# Patient Record
Sex: Female | Born: 1980 | Race: Black or African American | Hispanic: No | Marital: Single | Smoking: Never smoker
Health system: Southern US, Community
[De-identification: ages and names within clinical notes are randomized; demographics above are authoritative.]

## PROBLEM LIST (undated history)

## (undated) DIAGNOSIS — K5080 Crohn's disease of both small and large intestine without complications: Secondary | ICD-10-CM

## (undated) DIAGNOSIS — K602 Anal fissure, unspecified: Secondary | ICD-10-CM

## (undated) DIAGNOSIS — K2970 Gastritis, unspecified, without bleeding: Secondary | ICD-10-CM

## (undated) DIAGNOSIS — E538 Deficiency of other specified B group vitamins: Secondary | ICD-10-CM

## (undated) DIAGNOSIS — D509 Iron deficiency anemia, unspecified: Secondary | ICD-10-CM

## (undated) HISTORY — DX: Crohn's disease of both small and large intestine without complications: K50.80

## (undated) HISTORY — DX: Iron deficiency anemia, unspecified: D50.9

## (undated) HISTORY — DX: Gastritis, unspecified, without bleeding: K29.70

## (undated) HISTORY — DX: Deficiency of other specified B group vitamins: E53.8

## (undated) HISTORY — DX: Anal fissure, unspecified: K60.2

## (undated) HISTORY — PX: COLONOSCOPY: SHX174

## (undated) HISTORY — PX: HERNIA REPAIR: SHX51

---

## 2002-11-24 ENCOUNTER — Other Ambulatory Visit: Admission: RE | Admit: 2002-11-24 | Discharge: 2002-11-24 | Payer: Self-pay | Admitting: Family Medicine

## 2003-08-31 ENCOUNTER — Emergency Department (HOSPITAL_COMMUNITY): Admission: EM | Admit: 2003-08-31 | Discharge: 2003-08-31 | Payer: Self-pay | Admitting: Emergency Medicine

## 2003-09-20 ENCOUNTER — Emergency Department (HOSPITAL_COMMUNITY): Admission: EM | Admit: 2003-09-20 | Discharge: 2003-09-20 | Payer: Self-pay | Admitting: Emergency Medicine

## 2004-01-18 ENCOUNTER — Encounter (INDEPENDENT_AMBULATORY_CARE_PROVIDER_SITE_OTHER): Payer: Self-pay | Admitting: *Deleted

## 2004-01-18 ENCOUNTER — Ambulatory Visit (HOSPITAL_COMMUNITY): Admission: AD | Admit: 2004-01-18 | Discharge: 2004-01-18 | Payer: Self-pay | Admitting: Surgery

## 2004-01-18 HISTORY — PX: INCISION AND DRAINAGE ABSCESS ANAL: SUR669

## 2005-07-02 ENCOUNTER — Other Ambulatory Visit: Admission: RE | Admit: 2005-07-02 | Discharge: 2005-07-02 | Payer: Self-pay | Admitting: Obstetrics and Gynecology

## 2006-11-27 ENCOUNTER — Other Ambulatory Visit: Admission: RE | Admit: 2006-11-27 | Discharge: 2006-11-27 | Payer: Self-pay | Admitting: Obstetrics and Gynecology

## 2007-12-01 ENCOUNTER — Other Ambulatory Visit: Admission: RE | Admit: 2007-12-01 | Discharge: 2007-12-01 | Payer: Self-pay | Admitting: Obstetrics and Gynecology

## 2008-06-15 DIAGNOSIS — K5080 Crohn's disease of both small and large intestine without complications: Secondary | ICD-10-CM

## 2008-06-15 HISTORY — DX: Crohn's disease of both small and large intestine without complications: K50.80

## 2008-07-13 ENCOUNTER — Encounter: Payer: Self-pay | Admitting: Nurse Practitioner

## 2008-07-13 ENCOUNTER — Ambulatory Visit: Payer: Self-pay | Admitting: Gastroenterology

## 2008-07-13 ENCOUNTER — Emergency Department (HOSPITAL_COMMUNITY): Admission: EM | Admit: 2008-07-13 | Discharge: 2008-07-13 | Payer: Self-pay | Admitting: Emergency Medicine

## 2008-07-15 ENCOUNTER — Telehealth: Payer: Self-pay | Admitting: Nurse Practitioner

## 2008-07-22 ENCOUNTER — Encounter: Payer: Self-pay | Admitting: Nurse Practitioner

## 2008-07-22 ENCOUNTER — Ambulatory Visit: Payer: Self-pay | Admitting: Gastroenterology

## 2008-07-22 DIAGNOSIS — E538 Deficiency of other specified B group vitamins: Secondary | ICD-10-CM

## 2008-07-22 DIAGNOSIS — D649 Anemia, unspecified: Secondary | ICD-10-CM

## 2008-07-22 DIAGNOSIS — R1031 Right lower quadrant pain: Secondary | ICD-10-CM

## 2008-07-22 DIAGNOSIS — K5000 Crohn's disease of small intestine without complications: Secondary | ICD-10-CM

## 2008-07-23 ENCOUNTER — Ambulatory Visit: Payer: Self-pay | Admitting: Gastroenterology

## 2008-07-23 ENCOUNTER — Encounter: Payer: Self-pay | Admitting: Gastroenterology

## 2008-07-27 ENCOUNTER — Ambulatory Visit: Payer: Self-pay | Admitting: Gastroenterology

## 2008-07-28 ENCOUNTER — Telehealth: Payer: Self-pay | Admitting: Gastroenterology

## 2008-08-11 ENCOUNTER — Telehealth: Payer: Self-pay | Admitting: Gastroenterology

## 2008-08-27 ENCOUNTER — Telehealth: Payer: Self-pay | Admitting: Gastroenterology

## 2008-09-14 ENCOUNTER — Ambulatory Visit: Payer: Self-pay | Admitting: Gastroenterology

## 2008-09-14 DIAGNOSIS — R1013 Epigastric pain: Secondary | ICD-10-CM | POA: Insufficient documentation

## 2008-09-14 DIAGNOSIS — D518 Other vitamin B12 deficiency anemias: Secondary | ICD-10-CM | POA: Insufficient documentation

## 2008-09-14 DIAGNOSIS — D509 Iron deficiency anemia, unspecified: Secondary | ICD-10-CM

## 2008-12-23 ENCOUNTER — Other Ambulatory Visit: Admission: RE | Admit: 2008-12-23 | Discharge: 2008-12-23 | Payer: Self-pay | Admitting: Obstetrics and Gynecology

## 2009-12-26 ENCOUNTER — Other Ambulatory Visit: Admission: RE | Admit: 2009-12-26 | Discharge: 2009-12-26 | Payer: Self-pay | Admitting: Obstetrics and Gynecology

## 2009-12-29 ENCOUNTER — Encounter: Admission: RE | Admit: 2009-12-29 | Discharge: 2009-12-29 | Payer: Self-pay | Admitting: Obstetrics and Gynecology

## 2010-02-02 ENCOUNTER — Telehealth: Payer: Self-pay | Admitting: Gastroenterology

## 2010-08-05 ENCOUNTER — Other Ambulatory Visit: Payer: Self-pay | Admitting: Internal Medicine

## 2010-08-05 DIAGNOSIS — E041 Nontoxic single thyroid nodule: Secondary | ICD-10-CM

## 2010-08-15 NOTE — Progress Notes (Signed)
Summary: Triage  Phone Note Call from Patient Call back at [phone]   Caller: Patient Call For: Dr. Russella Dar Reason for Call: Talk to Nurse Summary of Call: Having problems w/her acid reflux wants to know if something can be prescribed Initial call taken by: Karna Christmas,  February 02, 2010 2:19 PM  Follow-up for Phone Call        Left message for patient to call back Darcey Nora RN, [facility]  February 02, 2010 2:37 PM  patient returned my call she c/o gas and bloating, epigastric pain.  She is asked to resume her Prilosec she stopped last year.  I also reviewed a low gas diet with her and suggested that she start on Align.  If she has no relief from these she will need to come in and see Dr Iva Lento.  patient agrees with the plan. Follow-up by: Darcey Nora RN, CGRN,  February 02, 2010 2:57 PM

## 2010-08-21 ENCOUNTER — Inpatient Hospital Stay: Admission: RE | Admit: 2010-08-21 | Payer: Self-pay | Source: Ambulatory Visit

## 2010-11-28 NOTE — Consult Note (Signed)
[name], [name]             ACCOUNT NO.:  [id number]   MEDICAL RECORD NO.:  [id number]          PATIENT TYPE:  EMS   LOCATION:  ED                           FACILITY:  [facility]   PHYSICIAN:  Willette Cluster, NP     DATE OF BIRTH:  [birth year]   DATE OF CONSULTATION:  07/13/2008  DATE OF DISCHARGE:                                 CONSULTATION   IMPRESSION:  10. A 30 year old black female with acute right lower quadrant pain in      the setting of a computerized axial tomography (CAT) scan revealing      wall thickening of a long segment of terminal ileum.  Normal white      count, low MCV.  Rule out infectious ileitis.  Rule out Crohn      disease.  Low suspicion for ischemia.  2. Microcytosis with hemoglobin of 11.7.  3. Mild microcytic anemia.   PLAN:  1. Stable enough for discharge home.  We will obtain iron studies, C-      reactive protein and erythrocyte sedimentation rate.  2. Prescription for Zofran, Cipro, and Flagyl given.  Patient will be      provided a prescription for Percocet to use as needed for pain.      She was cautioned against operating any machinery.  3. Clear liquid diet for the next 2 to 3 days.  4. Patient will follow up in the office next week.  Our office will      call her tomorrow with an appointment.  5. Colonoscopy pending clinical course.   HISTORY OF PRESENT ILLNESS:  [name] is a 30 year old female with no  significant past medical history.  She was awoken this morning with  severe right lower quadrant pain.  The patient had a bowel movement,  pain was not relieved.  She later took a bath, got ready for work, and  then ended up coming to the emergency department.  The patient has never  had this type of pain before.  It has not been associated with any  change in bowel habits.  The patient has a low-grade fever in the  emergency department.  She has mild nausea, but no vomiting.  There has  been no unusual weight loss at home.  No skin  rashes.  Family medical  history is negative for inflammatory bowel disease.  The patient felt  completely well prior to going to bed last night.   PAST MEDICAL HISTORY:  No significant past medical history.   HOME MEDICATIONS:  None.   ALLERGIES:  No known drug allergies.   FAMILY HISTORY:  No inflammatory bowel disease.   SOCIAL HISTORY:  Occasional alcohol.  Nonsmoker.  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  No children.   REVIEW OF SYSTEMS:  Positive for low-grade temp.  All other review of  systems are negative other than were noted in the HPI.   LABORATORY STUDIES:  WBC 7.9, hemoglobin 11.7, hematocrit 37.2, MCV  73.5, platelets 403.   DIAGNOSTIC STUDIES:  CT of the abdomen and pelvis with contrast reveals  wall thickening of a long segment of terminal ileum.  Small to moderate  amount of free fluid in the pelvis.   PHYSICAL EXAMINATION:  VITAL SIGNS:  Temp 98.3 (T-max 100).  Heart rate  103, blood pressure 138/82.  HEENT:  Conjunctivae pale.  No icterus.  No oral lesions.  Tongue moist.  NECK:  No palpable lymphadenopathy.  Supple.  No masses.  LUNGS:  Clear to auscultation bilaterally.  CARDIAC:  Slightly tachycardic.  ABDOMEN:  Soft and nondistended.  There is right lower quadrant  tenderness to palpation.  No guarding.  No masses or hepatosplenomegaly.  EXTREMITIES:  No lower extremity edema.  NEUROLOGIC:  Alert and oriented.  SKIN:  No rashes or significant lesions.      Willette Cluster, NP     PG/MEDQ  D:  07/13/2008  T:  07/13/2008  Job:  [id number]

## 2010-12-01 NOTE — Op Note (Signed)
NAMEJAYLANI, [name]                       ACCOUNT NO.:  [id number]   MEDICAL RECORD NO.:  [id number]                   PATIENT TYPE:  AMB   LOCATION:  DAY                                  FACILITY:  [facility]   PHYSICIAN:  Thornton Park. Daphine Deutscher, M.D.             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   PROCEDURE:  Incision and drainage of perirectal abscess and posterior anal  fistulotomy.  Also, incision and drainage of multiple perineal and labial  abscesses (probable hydradenitis suppurative, cultured).   SURGEON:  Thornton Park. Daphine Deutscher, M.D.   ANESTHESIA:  General endotracheal.   DESCRIPTION OF PROCEDURE:  [name] was seen by me in the surgical  day care preop after Dr. Zachery Dakins sent her over for this procedure.  Informed consent was obtained regarding the possibility of not only  infection, recurrent, but also anal incontinence.  Patient was taken over  for exam under anesthesia with drainage of his abscess and possible  fistulotomy.   In the dorsal lithotomy position after general anesthesia was administered,  I saw pus draining out posteriorly and found the draining site slightly to  the left of the midline.  I inserted a rectal probe with an anal retractor  in place.  This seemed to go through the superficial sphincter and was a  very superficial fistulous tract.  I went ahead and opened that with a  knife, cauterizing the base.  It did not seem to violate the deep  musculature.  I saw no other entrance points for that, and I could feel no  other areas of fluctuance or induration.  Therefore, I went ahead and  massaged some Betadine ointment into the area.  In addition, she had  complained of some abscesses in her thighs, and these were in fact more on  the labia majora.  There were multiple  areas, probably about five on the  right and three on the left.  The ones on the left communicated with each  other.  I opened the very thin, attenuated skin overlying these chronic  hydradenitis-appearing abscesses.  I cultured the two on the right, opened  these, and allowed material to drain out, including a little punctum in one.  These were then dressed with Betadine ointment.   The patient will be placed on Augmentin 875 mg to take twice a day for two  weeks.  As well, I  instructed her husband and asked him to put her on sitz  baths twice a day and use Hibiclens to wash her bottom and perivaginal  region.  I will see her back in the office in that time.  I also gave her a  prescription of Vicodin to take as needed for pain.  Thornton Park Daphine Deutscher, M.D.    MBM/MEDQ  D:  01/18/2004  T:  01/18/2004  Job:  [id number]

## 2011-04-20 LAB — COMPREHENSIVE METABOLIC PANEL
ALT: 10 U/L (ref 0–35)
AST: 15 U/L (ref 0–37)
CO2: 24 mEq/L (ref 19–32)
Calcium: 8.9 mg/dL (ref 8.4–10.5)
Chloride: 103 mEq/L (ref 96–112)
GFR calc Af Amer: >60 mL/min (ref >60)
GFR calc non Af Amer: >60 mL/min (ref >60)
Sodium: 138 mEq/L (ref 135–145)

## 2011-04-20 LAB — DIFFERENTIAL
Basophils Relative: 0 % (ref 0–1)
Eosinophils Absolute: 0.1 10*3/uL (ref 0.0–0.7)
Eosinophils Relative: 1 % (ref 0–5)
Lymphs Abs: 0.4 10*3/uL — ABNORMAL LOW (ref 0.7–4.0)

## 2011-04-20 LAB — CBC
MCHC: 31.5 g/dL (ref 30.0–36.0)
RBC: 5.06 MIL/uL (ref 3.87–5.11)
WBC: 7.9 10*3/uL (ref 4.0–10.5)

## 2011-04-20 LAB — FOLATE: Folate: 5.8 ng/mL

## 2011-04-20 LAB — URINALYSIS, ROUTINE W REFLEX MICROSCOPIC
Nitrite: NEGATIVE
Specific Gravity, Urine: 1.027 (ref 1.005–1.030)
pH: 8 (ref 5.0–8.0)

## 2011-04-20 LAB — IRON AND TIBC: Saturation Ratios: 8 % — ABNORMAL LOW (ref 20–55)

## 2011-04-20 LAB — VITAMIN B12: Vitamin B-12: 159 pg/mL — ABNORMAL LOW (ref 211–911)

## 2011-07-17 DIAGNOSIS — K2970 Gastritis, unspecified, without bleeding: Secondary | ICD-10-CM

## 2011-07-17 HISTORY — DX: Gastritis, unspecified, without bleeding: K29.70

## 2011-08-29 ENCOUNTER — Other Ambulatory Visit: Payer: Self-pay | Admitting: Obstetrics and Gynecology

## 2011-08-29 ENCOUNTER — Other Ambulatory Visit (HOSPITAL_COMMUNITY)
Admission: RE | Admit: 2011-08-29 | Discharge: 2011-08-29 | Disposition: A | Discharge status: Home / Self Care | Payer: BC Managed Care – PPO | Source: Ambulatory Visit | Attending: Obstetrics and Gynecology | Admitting: Obstetrics and Gynecology

## 2011-08-29 DIAGNOSIS — Z1159 Encounter for screening for other viral diseases: Secondary | ICD-10-CM | POA: Insufficient documentation

## 2011-08-29 DIAGNOSIS — Z01419 Encounter for gynecological examination (general) (routine) without abnormal findings: Secondary | ICD-10-CM | POA: Insufficient documentation

## 2011-12-13 ENCOUNTER — Encounter: Payer: Self-pay | Admitting: Gastroenterology

## 2012-01-08 ENCOUNTER — Other Ambulatory Visit (INDEPENDENT_AMBULATORY_CARE_PROVIDER_SITE_OTHER): Payer: BC Managed Care – PPO

## 2012-01-08 ENCOUNTER — Encounter: Payer: Self-pay | Admitting: Gastroenterology

## 2012-01-08 ENCOUNTER — Ambulatory Visit (INDEPENDENT_AMBULATORY_CARE_PROVIDER_SITE_OTHER): Payer: BC Managed Care – PPO | Admitting: Gastroenterology

## 2012-01-08 VITALS — BP 102/68 | Ht 68.0 in | Wt 158.6 lb

## 2012-01-08 DIAGNOSIS — K5080 Crohn's disease of both small and large intestine without complications: Secondary | ICD-10-CM

## 2012-01-08 DIAGNOSIS — E538 Deficiency of other specified B group vitamins: Secondary | ICD-10-CM

## 2012-01-08 DIAGNOSIS — R1319 Other dysphagia: Secondary | ICD-10-CM

## 2012-01-08 LAB — CBC WITH DIFFERENTIAL/PLATELET
Basophils Absolute: 0 10*3/uL (ref 0.0–0.1)
Basophils Relative: 0.5 % (ref 0.0–3.0)
Eosinophils Absolute: 0.2 10*3/uL (ref 0.0–0.7)
Hemoglobin: 9.9 g/dL — ABNORMAL LOW (ref 12.0–15.0)
Lymphocytes Relative: 14.4 % (ref 12.0–46.0)
MCHC: 31 g/dL (ref 30.0–36.0)
MCV: 74.7 fl — ABNORMAL LOW (ref 78.0–100.0)
Monocytes Absolute: 0.6 10*3/uL (ref 0.1–1.0)
Neutro Abs: 4 10*3/uL (ref 1.4–7.7)
RDW: 17.5 % — ABNORMAL HIGH (ref 11.5–14.6)
WBC: 5.6 10*3/uL (ref 4.5–10.5)

## 2012-01-08 LAB — IBC PANEL
Iron: 14 ug/dL — ABNORMAL LOW (ref 42–145)
Saturation Ratios: 4.6 % — ABNORMAL LOW (ref 20.0–50.0)
Transferrin: 217.7 mg/dL (ref 212.0–360.0)

## 2012-01-08 LAB — FERRITIN: Ferritin: 18.8 ng/mL (ref 10.0–291.0)

## 2012-01-08 LAB — COMPREHENSIVE METABOLIC PANEL
ALT: 11 U/L (ref 0–35)
AST: 17 U/L (ref 0–37)
Alkaline Phosphatase: 54 U/L (ref 39–117)
BUN: 7 mg/dL (ref 6–23)
Calcium: 8.5 mg/dL (ref 8.4–10.5)
Chloride: 105 mEq/L (ref 96–112)
Creatinine, Ser: 0.7 mg/dL (ref 0.4–1.2)
Total Bilirubin: 0.2 mg/dL — ABNORMAL LOW (ref 0.3–1.2)

## 2012-01-08 LAB — VITAMIN B12: Vitamin B-12: 167 pg/mL — ABNORMAL LOW (ref 211–911)

## 2012-01-08 LAB — TSH: TSH: 0.61 u[IU]/mL (ref 0.35–5.50)

## 2012-01-08 MED ORDER — OMEPRAZOLE 20 MG PO CPDR: 20.0000 mg | DELAYED_RELEASE_CAPSULE | Freq: Every day | ORAL | Status: DC

## 2012-01-08 NOTE — Patient Instructions (Addendum)
Your physician has requested that you go to the basement for the following lab work before leaving today:CBC, Cmet, Vitamin b12, Iron studies,TSH. You have been scheduled for an endoscopy with propofol. Please follow written instructions given to you at your visit today. You have been scheduled for a Barium Esophogram at [facility] (1st floor of the hospital) on 01/10/12 at 11:00am. Please arrive 15 minutes prior to your appointment for registration. Make certain not to have anything to eat or drink 6 hours prior to your test. If you need to reschedule for any reason, please contact radiology at [phone] to do so. Stop taking Zantac and pick up your prescription for omeprazole from your pharmacy.  Cc: [facility]

## 2012-01-08 NOTE — Progress Notes (Addendum)
History of Present Illness: This is a 31 year old female who complains of difficulty swallowing solids and liquids for the past several months. She has intermittent mild postprandial epigastric pain. She was treated with ranitidine 150 mg twice daily without any improvement in symptoms. She has a history of Crohn's ileocolitis and has not returned for recommended follow up for 3 years. She has a prior history of B12 deficiency and she has been noncompliant with treatment. Denies weight loss, abdominal pain, constipation, diarrhea, change in stool caliber, melena, hematochezia, nausea, vomiting, reflux symptoms, chest pain. Not on File No outpatient prescriptions prior to visit.   Past Medical History  Diagnosis Date  . Crohn's ileocolitis 06/2008  . Iron deficiency anemia   . B12 deficiency   . Anal fissure    Past Surgical History  Procedure Date  . Rectovaginal fistula closure   . Hernia repair    History   Social History  . Marital Status: Married    Spouse Name: N/A    Number of Children: N/A  . Years of Education: N/A   Occupational History  . [employer]    Social History Main Topics  . Smoking status: Never Smoker   . Smokeless tobacco: Never Used  . Alcohol Use: No  . Drug Use: No  . Sexually Active: None   Other Topics Concern  . None   Social History Narrative  . None   Family History  Problem Relation Age of Onset  . Lung cancer Father    Review of Systems: Pertinent positive and negative review of systems were noted in the above HPI section. All other review of systems were otherwise negative.  Physical Exam: General: Well developed , well nourished, no acute distress Head: Normocephalic and atraumatic Eyes:  sclerae anicteric, EOMI Ears: Normal auditory acuity Mouth: No deformity or lesions Neck: Supple, no masses or thyromegaly Lungs: Clear throughout to auscultation Heart: Regular rate and rhythm; no rubs or bruits; soft systolic murmur Abdomen:  Soft, mild epigastric tenderness to deep palpation without rebound or guarding and non distended. No masses, hepatosplenomegaly or hernias noted. Normal Bowel sounds Musculoskeletal: Symmetrical with no gross deformities  Skin: No lesions on visible extremities Pulses:  Normal pulses noted Extremities: No clubbing, cyanosis, edema or deformities noted Neurological: Alert oriented x 4, grossly nonfocal Cervical Nodes:  No significant cervical adenopathy Inguinal Nodes: No significant inguinal adenopathy Psychological:  Alert and cooperative. Normal mood and affect  Assessment and Recommendations:  1. Dysphagia to solids and liquids. Rule out esophagitis, GERD, strictures and motility disturbances. Change to omeprazole 20 mg daily. Schedule barium esophagram with tablet and upper endoscopy.  The risks, benefits, and alternatives to endoscopy with possible biopsy and possible dilation were discussed with the patient and they consent to proceed.   2. Crohn's ileocolitis. She does not appear to have active symptoms at this point. CBC, TSH and CMET today. She has been noncompliant with recommended follow up.  3. History of B12 deficiency anemia. Obtain a CBC and B12 level. She's been noncompliant with recommended treatment and follow up.   4. History of iron deficiency anemia. CBC and iron studies today.

## 2012-01-09 ENCOUNTER — Other Ambulatory Visit: Payer: Self-pay

## 2012-01-09 ENCOUNTER — Encounter: Payer: Self-pay | Admitting: Gastroenterology

## 2012-01-09 DIAGNOSIS — E538 Deficiency of other specified B group vitamins: Secondary | ICD-10-CM

## 2012-01-09 MED ORDER — FERROUS SULFATE 325 (65 FE) MG PO TABS: 325.0000 mg | ORAL_TABLET | Freq: Two times a day (BID) | ORAL | Status: DC

## 2012-01-09 MED ORDER — CYANOCOBALAMIN 1000 MCG/ML IJ SOLN
1000.0000 ug | INTRAMUSCULAR | Status: AC
  Administered 2012-01-10 – 2012-02-01 (×4): 1000 ug via INTRAMUSCULAR

## 2012-01-10 ENCOUNTER — Ambulatory Visit (HOSPITAL_COMMUNITY)
Admission: RE | Admit: 2012-01-10 | Discharge: 2012-01-10 | Disposition: A | Discharge status: Home / Self Care | Payer: BC Managed Care – PPO | Source: Ambulatory Visit | Attending: Gastroenterology | Admitting: Gastroenterology

## 2012-01-10 ENCOUNTER — Ambulatory Visit (INDEPENDENT_AMBULATORY_CARE_PROVIDER_SITE_OTHER): Payer: BC Managed Care – PPO | Admitting: Gastroenterology

## 2012-01-10 DIAGNOSIS — K2289 Other specified disease of esophagus: Secondary | ICD-10-CM | POA: Insufficient documentation

## 2012-01-10 DIAGNOSIS — R1310 Dysphagia, unspecified: Secondary | ICD-10-CM | POA: Insufficient documentation

## 2012-01-10 DIAGNOSIS — R1319 Other dysphagia: Secondary | ICD-10-CM

## 2012-01-10 DIAGNOSIS — E538 Deficiency of other specified B group vitamins: Secondary | ICD-10-CM

## 2012-01-10 DIAGNOSIS — K224 Dyskinesia of esophagus: Secondary | ICD-10-CM | POA: Insufficient documentation

## 2012-01-10 DIAGNOSIS — K228 Other specified diseases of esophagus: Secondary | ICD-10-CM | POA: Insufficient documentation

## 2012-01-10 DIAGNOSIS — D519 Vitamin B12 deficiency anemia, unspecified: Secondary | ICD-10-CM

## 2012-01-15 ENCOUNTER — Other Ambulatory Visit: Payer: Self-pay | Admitting: Family Medicine

## 2012-01-15 DIAGNOSIS — E010 Iodine-deficiency related diffuse (endemic) goiter: Secondary | ICD-10-CM

## 2012-01-15 DIAGNOSIS — E041 Nontoxic single thyroid nodule: Secondary | ICD-10-CM

## 2012-01-18 ENCOUNTER — Ambulatory Visit (INDEPENDENT_AMBULATORY_CARE_PROVIDER_SITE_OTHER): Payer: BC Managed Care – PPO | Admitting: Gastroenterology

## 2012-01-18 DIAGNOSIS — E538 Deficiency of other specified B group vitamins: Secondary | ICD-10-CM

## 2012-01-18 DIAGNOSIS — D519 Vitamin B12 deficiency anemia, unspecified: Secondary | ICD-10-CM

## 2012-01-21 ENCOUNTER — Ambulatory Visit
Admission: RE | Admit: 2012-01-21 | Discharge: 2012-01-21 | Disposition: A | Discharge status: Home / Self Care | Payer: BC Managed Care – PPO | Source: Ambulatory Visit | Attending: Family Medicine | Admitting: Family Medicine

## 2012-01-21 DIAGNOSIS — E010 Iodine-deficiency related diffuse (endemic) goiter: Secondary | ICD-10-CM

## 2012-01-21 DIAGNOSIS — E041 Nontoxic single thyroid nodule: Secondary | ICD-10-CM

## 2012-01-25 ENCOUNTER — Ambulatory Visit (INDEPENDENT_AMBULATORY_CARE_PROVIDER_SITE_OTHER): Payer: BC Managed Care – PPO | Admitting: Gastroenterology

## 2012-01-25 DIAGNOSIS — E538 Deficiency of other specified B group vitamins: Secondary | ICD-10-CM

## 2012-01-25 DIAGNOSIS — D518 Other vitamin B12 deficiency anemias: Secondary | ICD-10-CM

## 2012-02-01 ENCOUNTER — Ambulatory Visit (INDEPENDENT_AMBULATORY_CARE_PROVIDER_SITE_OTHER): Payer: BC Managed Care – PPO | Admitting: Gastroenterology

## 2012-02-01 DIAGNOSIS — E538 Deficiency of other specified B group vitamins: Secondary | ICD-10-CM

## 2012-02-04 ENCOUNTER — Telehealth: Payer: Self-pay | Admitting: Gastroenterology

## 2012-02-04 NOTE — Telephone Encounter (Signed)
Left a message informing patient that if she has had her 4 weekly injections already then she needs to go on monthly injections starting on 03/03/12. Told her if she has not established with a PCP yet then she can schedule with [place] her next injection on 03/03/12. Then that should of gave her ample time to establish with a PCP. Told her to call me back with any other questions.

## 2012-02-15 ENCOUNTER — Ambulatory Visit (AMBULATORY_SURGERY_CENTER): Payer: BC Managed Care – PPO | Admitting: Gastroenterology

## 2012-02-15 ENCOUNTER — Encounter: Payer: Self-pay | Admitting: Gastroenterology

## 2012-02-15 VITALS — BP 119/73 | HR 94 | Temp 97.0°F | Resp 20 | Ht 68.0 in | Wt 158.0 lb

## 2012-02-15 DIAGNOSIS — K2990 Gastroduodenitis, unspecified, without bleeding: Secondary | ICD-10-CM

## 2012-02-15 DIAGNOSIS — R933 Abnormal findings on diagnostic imaging of other parts of digestive tract: Secondary | ICD-10-CM

## 2012-02-15 DIAGNOSIS — K2970 Gastritis, unspecified, without bleeding: Secondary | ICD-10-CM

## 2012-02-15 DIAGNOSIS — R1319 Other dysphagia: Secondary | ICD-10-CM

## 2012-02-15 DIAGNOSIS — D509 Iron deficiency anemia, unspecified: Secondary | ICD-10-CM

## 2012-02-15 MED ORDER — SODIUM CHLORIDE 0.9 % IV SOLN: 500.0000 mL | INTRAVENOUS | Status: DC

## 2012-02-15 NOTE — Progress Notes (Signed)
Patient did not experience any of the following events: a burn prior to discharge; a fall within the facility; wrong site/side/patient/procedure/implant event; or a hospital transfer or hospital admission upon discharge from the facility. (G8907) Patient did not have preoperative order for IV antibiotic SSI prophylaxis. (G8918)  

## 2012-02-15 NOTE — Op Note (Signed)
[facility] [address] [place], [place]  [address]  ENDOSCOPY PROCEDURE REPORT PATIENT:  Laura, Benjamin  MR#:  [id number] BIRTHDATE:  Aug 22, 1980, 31 yrs. old  GENDER:  female ENDOSCOPIST:  Judie Petit T. Russella Dar, MD, [facility]  PROCEDURE DATE:  02/15/2012 PROCEDURE:  EGD with biopsy and with dilatation over guidewire ASA CLASS:  Class II INDICATIONS:  dysphagia, abnormal imaging, iron deficiency anemia  MEDICATIONS:    MAC sedation, administered by CRNA, propofol (Diprivan) 200 mg IV, glycopyrrolate (Robinal) 0.2 mg TOPICAL ANESTHETIC:  Cetacaine Spray DESCRIPTION OF PROCEDURE:   After the risks benefits and alternatives of the procedure were thoroughly explained, informed consent was obtained.  The LB-GIF Q180 [id number] endoscope was introduced through the mouth and advanced to the second portion of the duodenum, without limitations.  The instrument was slowly withdrawn as the mucosa was fully examined. <<PROCEDUREIMAGES>> Mild gastritis was found in the antrum. It was erosive and erythematous. Multiple biopsies were obtained and sent to pathology.  Otherwise normal stomach.  The duodenal bulb was normal in appearance, as was the postbulbar duodenum.  The esophagus and gastroesophageal junction were completely normal in appearance. Savary / guidewire 17 mm with no heme or resistance noted.  Retroflexed views revealed no abnormalities.    The scope was then withdrawn from the patient and the procedure completed.  COMPLICATIONS:  None  ENDOSCOPIC IMPRESSION: 1) Mild gastritis in the antrum  RECOMMENDATIONS: 1) Anti-reflux regimen 2) Await pathology results 3) PPI qam, FeS04 bid, B12 monthly 4) Post dilation instructions  Shawna Wearing T. Russella Dar, MD, [facility]  CC:  Cain Saupe, MD  n. Rosalie DoctorVenita Lick. Lakshya Mcgillicuddy at 02/15/2012 03:23 PM  [place], [place], [phone]

## 2012-02-15 NOTE — Patient Instructions (Addendum)
Follow dilation diet today. See handout given. Nothing by mouth until  4:20 pm, then clear liquids for 1 hour, then soft foods rest of today. Resume regular diet in the morning. Take medications as directed by Dr.Stark. Daily Prilosec, Iron twice a day, and monthly B12. Anti-reflux handout given. Biopsies taken. Await pathology results. Thank you!!  YOU HAD AN ENDOSCOPIC PROCEDURE TODAY AT THE [facility]: Refer to the procedure report that was given to you for any specific questions about what was found during the examination.  If the procedure report does not answer your questions, please call your gastroenterologist to clarify.  If you requested that your care partner not be given the details of your procedure findings, then the procedure report has been included in a sealed envelope for you to review at your convenience later.  YOU SHOULD EXPECT: Some feelings of bloating in the abdomen. Passage of more gas than usual.  Walking can help get rid of the air that was put into your GI tract during the procedure and reduce the bloating. If you had a lower endoscopy (such as a colonoscopy or flexible sigmoidoscopy) you may notice spotting of blood in your stool or on the toilet paper. If you underwent a bowel prep for your procedure, then you may not have a normal bowel movement for a few days.  DIET: Your first meal following the procedure should be a light meal and then it is ok to progress to your normal diet.  A half-sandwich or bowl of soup is an example of a good first meal.  Heavy or fried foods are harder to digest and may make you feel nauseous or bloated.  Likewise meals heavy in dairy and vegetables can cause extra gas to form and this can also increase the bloating.  Drink plenty of fluids but you should avoid alcoholic beverages for 24 hours.  ACTIVITY: Your care partner should take you home directly after the procedure.  You should plan to take it easy, moving slowly for the rest of  the day.  You can resume normal activity the day after the procedure however you should NOT DRIVE or use heavy machinery for 24 hours (because of the sedation medicines used during the test).    SYMPTOMS TO REPORT IMMEDIATELY: A gastroenterologist can be reached at any hour.  During normal business hours, 8:30 AM to 5:00 PM Monday through Friday, call [phone].  After hours and on weekends, please call the GI answering service at [phone] who will take a message and have the physician on call contact you.   Following upper endoscopy (EGD)  Vomiting of blood or coffee ground material  New chest pain or pain under the shoulder blades  Painful or persistently difficult swallowing  New shortness of breath  Fever of 100F or higher  Black, tarry-looking stools  FOLLOW UP: If any biopsies were taken you will be contacted by phone or by letter within the next 1-3 weeks.  Call your gastroenterologist if you have not heard about the biopsies in 3 weeks.  Our staff will call the home number listed on your records the next business day following your procedure to check on you and address any questions or concerns that you may have at that time regarding the information given to you following your procedure. This is a courtesy call and so if there is no answer at the home number and we have not heard from you through the emergency physician on call, we  will assume that you have returned to your regular daily activities without incident.  SIGNATURES/CONFIDENTIALITY: You and/or your care partner have signed paperwork which will be entered into your electronic medical record.  These signatures attest to the fact that that the information above on your After Visit Summary has been reviewed and is understood.  Full responsibility of the confidentiality of this discharge information lies with you and/or your care-partner.

## 2012-02-18 ENCOUNTER — Telehealth: Payer: Self-pay

## 2012-02-18 NOTE — Telephone Encounter (Signed)
Left message on answering machine. 

## 2012-02-20 ENCOUNTER — Encounter: Payer: Self-pay | Admitting: Gastroenterology

## 2012-09-12 ENCOUNTER — Other Ambulatory Visit (HOSPITAL_COMMUNITY)
Admission: RE | Admit: 2012-09-12 | Discharge: 2012-09-12 | Disposition: A | Discharge status: Home / Self Care | Payer: BC Managed Care – PPO | Source: Ambulatory Visit | Attending: Obstetrics and Gynecology | Admitting: Obstetrics and Gynecology

## 2012-09-12 ENCOUNTER — Other Ambulatory Visit: Payer: Self-pay | Admitting: Obstetrics and Gynecology

## 2012-09-12 DIAGNOSIS — Z1151 Encounter for screening for human papillomavirus (HPV): Secondary | ICD-10-CM | POA: Insufficient documentation

## 2012-09-12 DIAGNOSIS — Z01419 Encounter for gynecological examination (general) (routine) without abnormal findings: Secondary | ICD-10-CM | POA: Insufficient documentation

## 2013-04-01 IMAGING — US US SOFT TISSUE HEAD/NECK
1 series · 13 of 25 positions shown · non-contrast
Comparison: Thyroid ultrasound 12/29/2009.

CLINICAL DATA: Thyromegaly.

THYROID ULTRASOUND
TECHNIQUE: Ultrasound examination of the thyroid gland and adjacent
soft tissues was performed.

[Series 1: us soft tissue head/neck · 0.07mm/px · 13 of 50 slices shown]
[im 1/50]
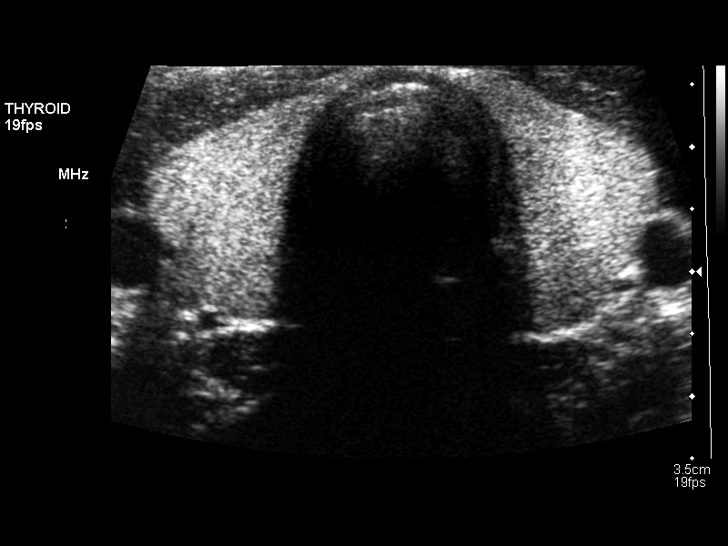
[im 5/50]
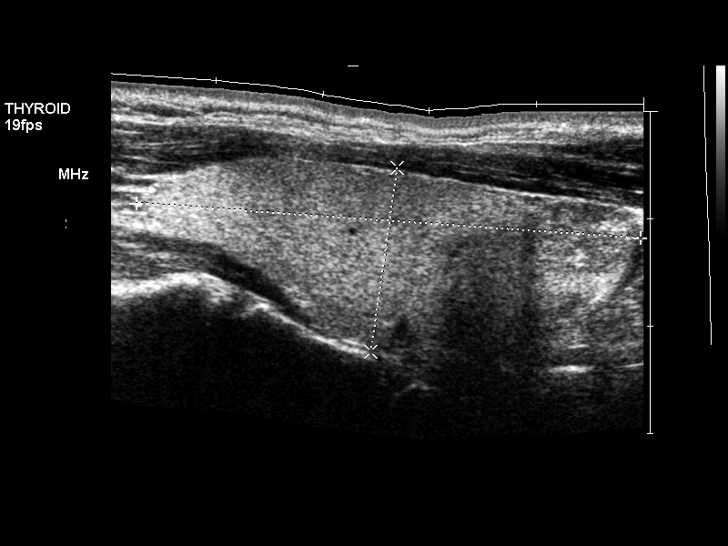
[im 9/50]
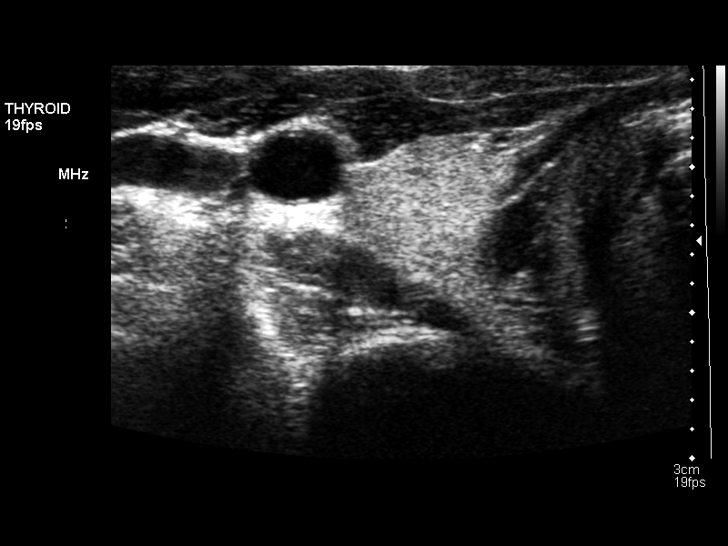
[im 13/50]
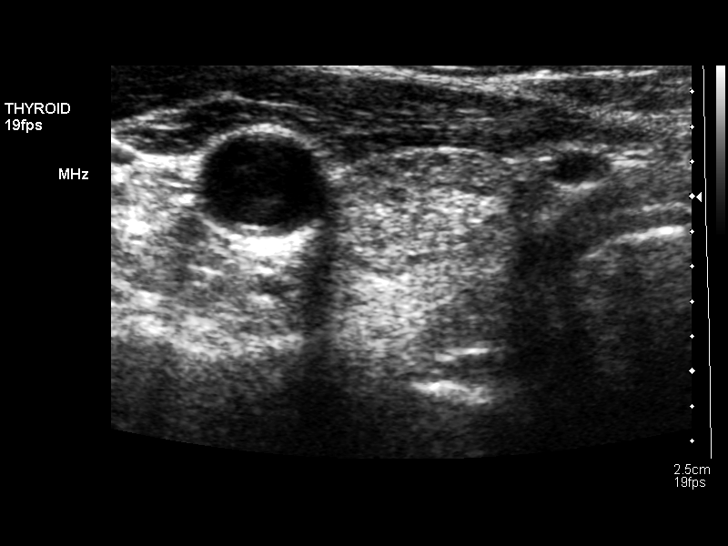
[im 17/50]
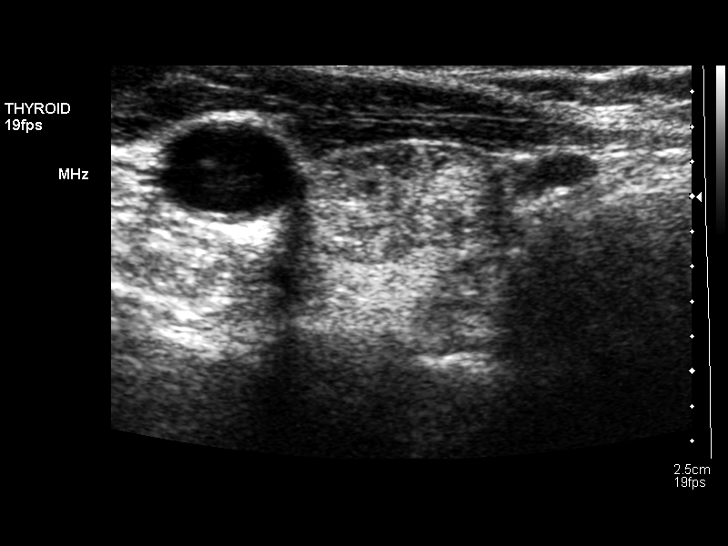
[im 21/50]
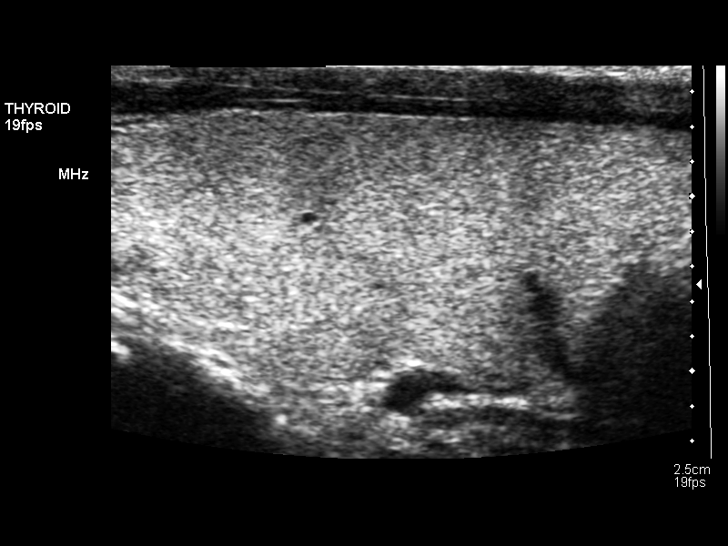
[im 25/50]
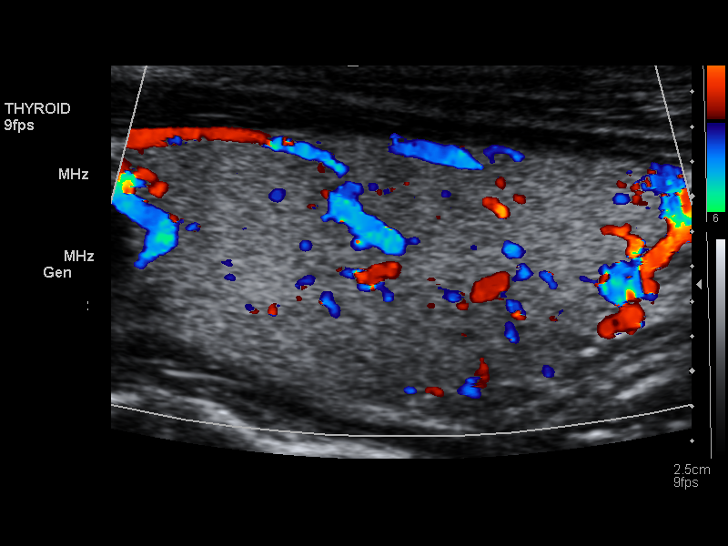
[im 29/50]
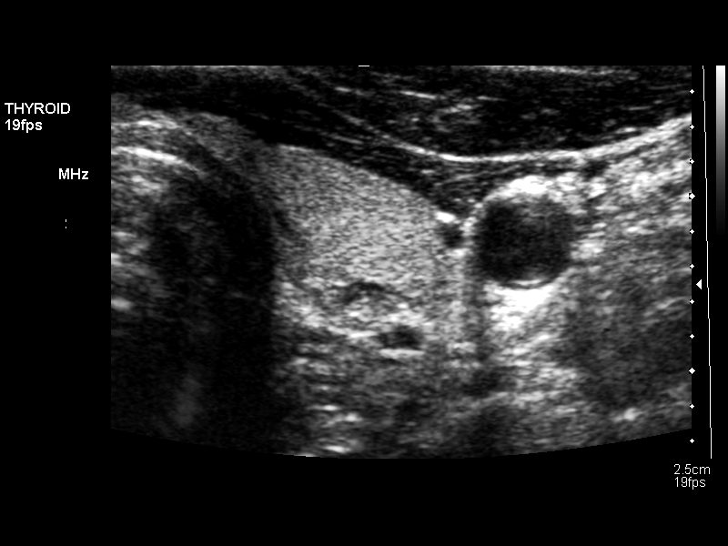
[im 33/50]
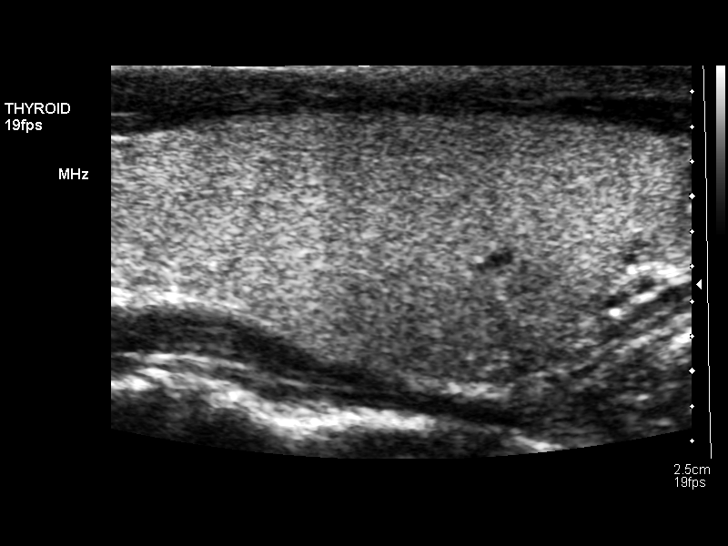
[im 37/50]
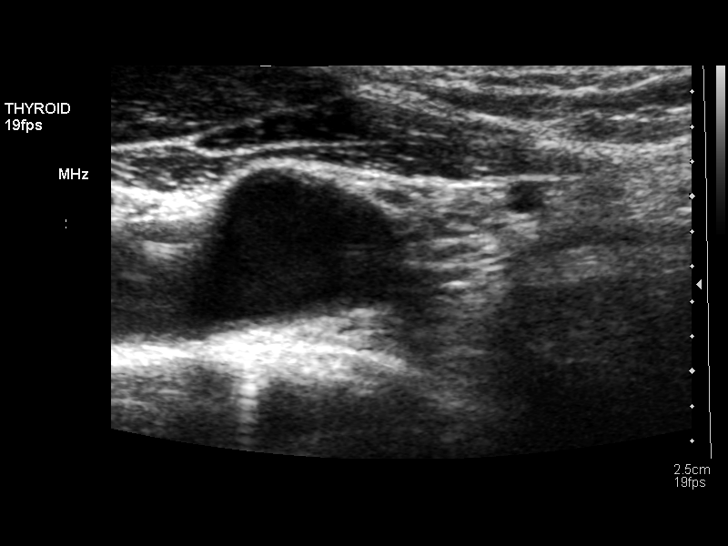
[im 41/50]
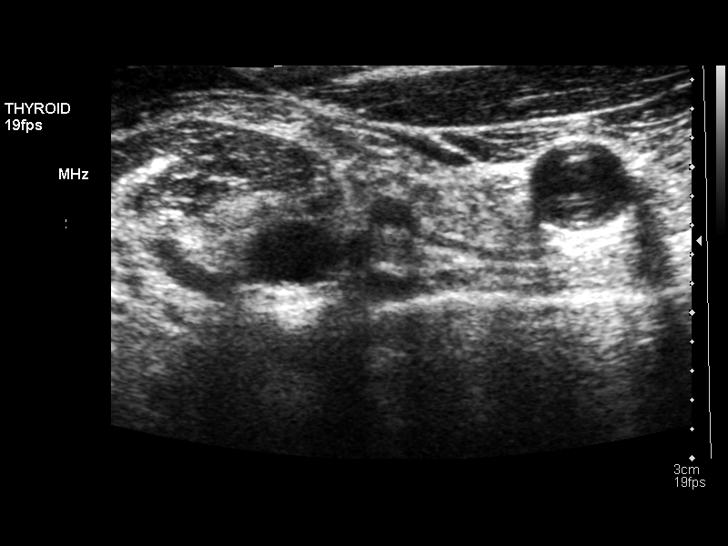
[im 45/50]
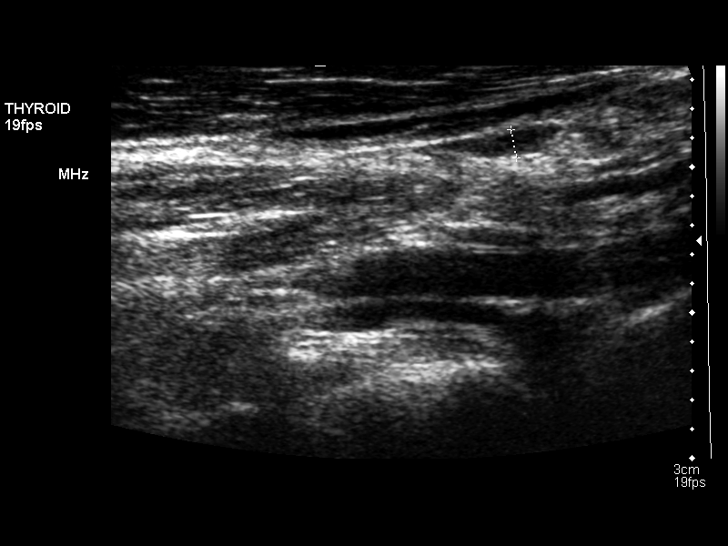
[im 50/50]
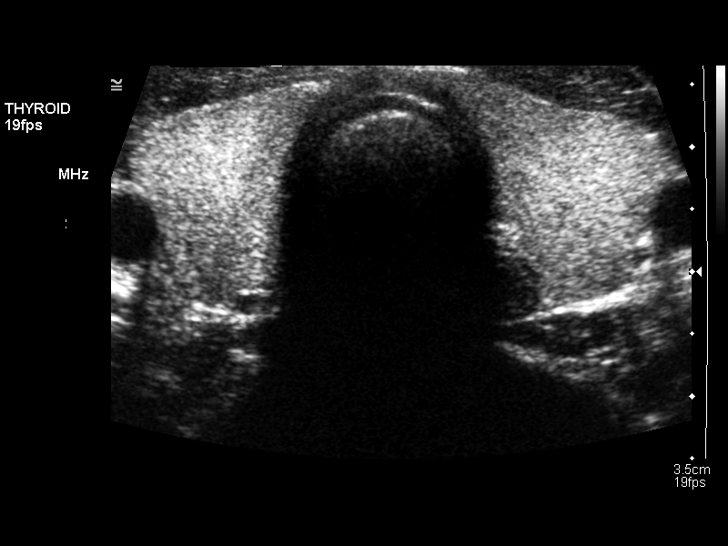

[13 of 25 positions shown; findings below may reference images not displayed]

FINDINGS: Right thyroid lobe:  4.7 x 1.7 x 1.3 cm.
Left thyroid lobe:  3.9 x 1.8 x 1.5 cm.
Isthmus:  2.6 mm in thickness.

Focal nodules:  The thyroid echotexture is homogeneous.  A mildly
heterogeneous primarily echogenic nodule inferiorly in the right
lobe measures 1.1 x 0.7 x 1.2 cm.  This is solid with internal
blood flow.  This previously measured 1.0 x 0.7 x 0.6 cm, although
the transverse dimension may have been underestimated.  Gross
appearance is similar.  No other nodules are identified.

Lymphadenopathy:  None visualized.
IMPRESSION: Solid nodule inferiorly in the right lobe may be slightly larger in
one dimension, although this difference may be technical. Findings
do not meet current SRU consensus criteria for biopsy.  Follow-up
by clinical exam is recommended.  If patient has known risk factors
for thyroid carcinoma, consider continued follow-up ultrasound in
12-24 months.  If patient is clinically hyperthyroid, consider
nuclear medicine thyroid uptake and scan.

## 2013-04-22 ENCOUNTER — Other Ambulatory Visit: Payer: Self-pay | Admitting: Family Medicine

## 2013-04-22 DIAGNOSIS — E041 Nontoxic single thyroid nodule: Secondary | ICD-10-CM

## 2013-04-27 ENCOUNTER — Other Ambulatory Visit: Payer: BC Managed Care – PPO

## 2013-07-28 ENCOUNTER — Emergency Department (HOSPITAL_COMMUNITY)
Admission: EM | Admit: 2013-07-28 | Discharge: 2013-07-28 | Disposition: A | Discharge status: Home / Self Care | Payer: BC Managed Care – PPO | Attending: Emergency Medicine | Admitting: Emergency Medicine

## 2013-07-28 ENCOUNTER — Encounter (HOSPITAL_COMMUNITY): Payer: Self-pay | Admitting: Emergency Medicine

## 2013-07-28 DIAGNOSIS — Z8719 Personal history of other diseases of the digestive system: Secondary | ICD-10-CM | POA: Insufficient documentation

## 2013-07-28 DIAGNOSIS — Z862 Personal history of diseases of the blood and blood-forming organs and certain disorders involving the immune mechanism: Secondary | ICD-10-CM | POA: Insufficient documentation

## 2013-07-28 DIAGNOSIS — R000 Tachycardia, unspecified: Secondary | ICD-10-CM | POA: Insufficient documentation

## 2013-07-28 DIAGNOSIS — J111 Influenza due to unidentified influenza virus with other respiratory manifestations: Secondary | ICD-10-CM | POA: Insufficient documentation

## 2013-07-28 DIAGNOSIS — Z79899 Other long term (current) drug therapy: Secondary | ICD-10-CM | POA: Insufficient documentation

## 2013-07-28 MED ORDER — KETOROLAC TROMETHAMINE 30 MG/ML IJ SOLN
30.0000 mg | Freq: Once | INTRAMUSCULAR | Status: AC
  Administered 2013-07-28: 30 mg via INTRAMUSCULAR
  Filled 2013-07-28: qty 1

## 2013-07-28 MED ORDER — GUAIFENESIN-CODEINE 100-10 MG/5ML PO SYRP: 5.0000 mL | ORAL_SOLUTION | Freq: Three times a day (TID) | ORAL | Status: DC | PRN

## 2013-07-28 NOTE — Discharge Instructions (Signed)

## 2013-07-28 NOTE — ED Provider Notes (Signed)
CSN: [id number]     Arrival date & time 07/28/13  1813 History   First MD Initiated Contact with Patient 07/28/13 1933     Chief Complaint  Patient presents with  . Influenza   (Consider location/radiation/quality/duration/timing/severity/associated sxs/prior Treatment) HPI  33 year old female with flulike symptoms. Symptom onset Monday. Progressively worsening. Fever 103. Diffuse body aches. Sore throat. Chills. Nonproductive cough. No shortness of breath. Headaches. No sick contacts. Patient is otherwise healthy. Does not receive his flu shot this year. No interventions prior to arrival.  Past Medical History  Diagnosis Date  . Crohn's ileocolitis 06/2008  . Iron deficiency anemia   . B12 deficiency   . Anal fissure    Past Surgical History  Procedure Laterality Date  . Rectovaginal fistula closure    . Hernia repair    . Colonoscopy     Family History  Problem Relation Age of Onset  . Lung cancer Father    History  Substance Use Topics  . Smoking status: Never Smoker   . Smokeless tobacco: Never Used  . Alcohol Use: No   OB History   Grav Para Term Preterm Abortions TAB SAB Ect Mult Living                 Review of Systems  All systems reviewed and negative, other than as noted in HPI.   Allergies  Review of patient's allergies indicates no known allergies.  Home Medications   Current Outpatient Rx  Name  Route  Sig  Dispense  Refill  . EXPIRED: omeprazole (PRILOSEC) 20 MG capsule   Oral   Take 1 capsule (20 mg total) by mouth daily.   30 capsule   11    BP 114/66  Pulse 108  Temp(Src) 102.2 F (39 C) (Oral)  Resp 20  Ht 5\' 11"  (1.803 m)  Wt 150 lb (68.04 kg)  BMI 20.93 kg/m2  SpO2 100% Physical Exam  Nursing note and vitals reviewed. Constitutional: She appears well-developed and well-nourished. No distress.  HENT:  Head: Normocephalic and atraumatic.  Eyes: Conjunctivae are normal. Right eye exhibits no discharge. Left eye exhibits no  discharge.  Neck: Neck supple.  Cardiovascular: Regular rhythm and normal heart sounds.  Exam reveals no gallop and no friction rub.   No murmur heard. tachy  Pulmonary/Chest: Effort normal and breath sounds normal. No respiratory distress.  Abdominal: Soft. She exhibits no distension. There is no tenderness.  Genitourinary:  No cva tenderness  Musculoskeletal: She exhibits no edema and no tenderness.  Neurological: She is alert.  Skin: Skin is warm and dry.  Psychiatric: She has a normal mood and affect. Her behavior is normal. Thought content normal.    ED Course  Procedures (including critical care time) Labs Review Labs Reviewed - No data to display Imaging Review No results found.  EKG Interpretation   None       MDM   1. Influenza    33 year old female with likely influenza. Patient is otherwise healthy. Tachycardic likely secondary to fever, otherwise hemodynamically stable. No increased work of breathing. Plan symptomatic tx at this time. Return precautions discussed.    Raeford RazorStephen Josephine Rudnick, MD 08/01/13 2021

## 2013-07-28 NOTE — ED Notes (Signed)
Pt presents to department for evaluation of flu like symptoms. Pt states body aches, headache, chills/fever and cough. Ongoing since Monday. Fever of 103.0. Pt is alert and oriented x4. Respirations unlabored. Pt is alert and oriented x4.

## 2013-07-28 NOTE — ED Notes (Signed)
Patient currently waiting med time before discharge.  

## 2013-08-19 ENCOUNTER — Telehealth: Payer: Self-pay | Admitting: Gastroenterology

## 2013-08-19 NOTE — Telephone Encounter (Signed)
completed

## 2013-09-11 ENCOUNTER — Other Ambulatory Visit: Payer: Self-pay | Admitting: Obstetrics and Gynecology

## 2013-09-11 ENCOUNTER — Other Ambulatory Visit (HOSPITAL_COMMUNITY)
Admission: RE | Admit: 2013-09-11 | Discharge: 2013-09-11 | Disposition: A | Discharge status: Home / Self Care | Payer: BC Managed Care – PPO | Source: Ambulatory Visit | Attending: Obstetrics and Gynecology | Admitting: Obstetrics and Gynecology

## 2013-09-11 DIAGNOSIS — Z124 Encounter for screening for malignant neoplasm of cervix: Secondary | ICD-10-CM | POA: Insufficient documentation

## 2013-09-11 DIAGNOSIS — Z1151 Encounter for screening for human papillomavirus (HPV): Secondary | ICD-10-CM | POA: Insufficient documentation

## 2013-11-23 ENCOUNTER — Other Ambulatory Visit: Payer: Self-pay | Admitting: Family

## 2013-11-23 DIAGNOSIS — E041 Nontoxic single thyroid nodule: Secondary | ICD-10-CM

## 2013-11-30 ENCOUNTER — Ambulatory Visit
Admission: RE | Admit: 2013-11-30 | Discharge: 2013-11-30 | Disposition: A | Discharge status: Home / Self Care | Payer: BC Managed Care – PPO | Source: Ambulatory Visit | Attending: Family | Admitting: Family

## 2013-11-30 DIAGNOSIS — E041 Nontoxic single thyroid nodule: Secondary | ICD-10-CM

## 2014-03-08 ENCOUNTER — Encounter (INDEPENDENT_AMBULATORY_CARE_PROVIDER_SITE_OTHER): Payer: Self-pay | Admitting: Surgery

## 2014-03-08 ENCOUNTER — Ambulatory Visit (INDEPENDENT_AMBULATORY_CARE_PROVIDER_SITE_OTHER): Payer: BC Managed Care – PPO | Admitting: Surgery

## 2014-03-08 VITALS — BP 90/62 | HR 68 | Temp 98.1°F | Ht 66.0 in | Wt 139.5 lb

## 2014-03-08 DIAGNOSIS — K624 Stenosis of anus and rectum: Secondary | ICD-10-CM

## 2014-03-08 DIAGNOSIS — K2970 Gastritis, unspecified, without bleeding: Secondary | ICD-10-CM | POA: Insufficient documentation

## 2014-03-08 DIAGNOSIS — K5000 Crohn's disease of small intestine without complications: Secondary | ICD-10-CM

## 2014-03-08 NOTE — Patient Instructions (Signed)
Anal Stricture Anal stricture (also called anal stenosis) is a narrowing of the anus. This is a condition that makes bowel movements difficult or painful. Anal stricture can range from mild to severe. CAUSES  Anal stricture can be caused by:  Surgery involving the anal canal, such as hemorrhoidectomy.  History of abscesses or infections.  Inflammatory bowel disease (IBD), such as Crohn disease.  Anal trauma.  Radiation therapy.  Sexually transmitted disease (STD).  Tuberculosis.  Overuse of laxatives. SYMPTOMS   Pain and pressure during bowel movements.  Bleeding during bowel movements.  Difficulty getting stool out.  Constipation.  Anal discomfort.  Narrow stools. DIAGNOSIS  Your health care provider will ask about your medical history and may perform the following:  Visual examination of the anal canal.  Digital rectal exam. The health care provider wears gloves and feels inside the rectum.  Anorectal manometry. A small balloon attached to a flexible tube is used to measure anal contraction and function. TREATMENT  Treatment will depend on the cause and severity of the stricture. For mild to moderate stricture, nonsurgical treatments are usually successful. Treatments for anal stricture may include:  Dietary changes, such as drinking more fluids and eating more high-fiber foods. Fiber supplements may also be used.  FIBER supplement  Dilation techniques that stretch the anal opening. These may be performed by a health care provider, or you may need to do them at home.  Surgery on the anal sphincter muscle (sphincterotomy).  Reconstructive surgery on the anus (anoplasty). HOME CARE INSTRUCTIONS   Only take over-the-counter or prescription medicines as directed by your health care provider.  Eat foods that are high in fiber, such as fruits, vegetables, whole grains, and beans. A fiber supplement may be added to your diet if you cannot get enough fiber from  foods.  Drink enough fluids to keep your urine clear or pale yellow.  Practice good hygiene to reduce the risk of infection.  Follow your health care provider's instructions on how and when to perform any dilation techniques.  Keep all follow-up appointments with your health care provider. SEEK MEDICAL CARE IF:   You have increased pain during bowel movements.  You have further bleeding during bowel movements.  You have difficulty or are unable to have a bowel movement. SEEK IMMEDIATE MEDICAL CARE IF:  You have a fever and your symptoms suddenly get worse. Document Released: 10/27/2012 Document Revised: 11/16/2013 Document Reviewed: 10/27/2012 [facility] Patient Information 2015 [place], [place]. This information is not intended to replace advice given to you by your health care provider. Make sure you discuss any questions you have with your health care provider.   Crohn Disease Crohn disease is a long-term (chronic) soreness and redness (inflammation) of the intestines (bowel). It can affect any portion of the digestive tract, from the mouth to the anus. It can also cause problems outside the digestive tract. Crohn disease is closely related to a disease called ulcerative colitis (together, these two diseases are called inflammatory bowel disease).  CAUSES  The cause of Crohn disease is not known. One [name] is that, in an easily affected person, the immune system is triggered to attack the body's own digestive tissue. Crohn disease runs in families. It seems to be more common in certain geographic areas and amongst certain races. There are no clear-cut dietary causes.  SYMPTOMS  Crohn disease can cause many different symptoms since it can affect many different parts of the body. Symptoms include:  Fatigue.  Weight loss.  Chronic  diarrhea, sometime bloody.  Abdominal pain and cramps.  Fever.  Ulcers or canker sores in the mouth or rectum.  Anemia (low red blood  cells).  Arthritis, skin problems, and eye problems may occur. Complications of Crohn disease can include:  Series of holes (perforation) of the bowel.  Portions of the intestines sticking to each other (adhesions).  Obstruction of the bowel.  Fistula formation, typically in the rectal area but also in other areas. A fistula is an opening between the bowels and the outside, or between the bowels and another organ.  A painful crack in the mucous membrane of the anus (rectal fissure). DIAGNOSIS  Your caregiver may suspect Crohn disease based on your symptoms and an exam. Blood tests may confirm that there is a problem. You may be asked to submit a stool specimen for examination. X-rays and CT scans may be necessary. Ultimately, the diagnosis is usually made after a procedure that uses a flexible tube that is inserted via your mouth or your anus. This is done under sedation and is called either an upper endoscopy or colonoscopy. With these tests, the specialist can take tiny tissue samples and remove them from the inside of the bowel (biopsy). Examination of this biopsy tissue under a microscope can reveal Crohn disease as the cause of your symptoms. Due to the many different forms that Crohn disease can take, symptoms may be present for several years before a diagnosis is made. TREATMENT  Medications are often used to decrease inflammation and control the immune system. These include medicines related to aspirin, steroid medications, and newer and stronger medications to slow down the immune system. Some medications may be used as suppositories or enemas. A number of other medications are used or have been studied. Your caregiver will make specific recommendations. HOME CARE INSTRUCTIONS   Symptoms such as diarrhea can be controlled with medications. Avoid foods that have a laxative effect such as fresh fruit, vegetables, and dairy products. During flare-ups, you can rest your bowel by refraining  from solid foods. Drink clear liquids frequently during the day. (Electrolyte or rehydrating fluids are best. Your caregiver can help you with suggestions.) Drink often to prevent loss of body fluids (dehydration). When diarrhea has cleared, eat small meals and more frequently. Avoid food additives and stimulants such as caffeine (coffee, tea, or chocolate). Enzyme supplements may help if you develop intolerance to a sugar in dairy products (lactose). Ask your caregiver or dietitian about specific dietary instructions.  Try to maintain a positive attitude. Learn relaxation techniques such as self-hypnosis, mental imaging, and muscle relaxation.  If possible, avoid stresses which can aggravate your condition.  Exercise regularly.  Follow your diet.  Always get plenty of rest. SEEK MEDICAL CARE IF:   Your symptoms fail to improve after a week or two of new treatment.  You experience continued weight loss.  You have ongoing cramps or loose bowels.  You develop a new skin rash, skin sores, or eye problems. SEEK IMMEDIATE MEDICAL CARE IF:   You have worsening of your symptoms or develop new symptoms.  You have a fever.  You develop bloody diarrhea.  You develop severe abdominal pain. MAKE SURE YOU:   Understand these instructions.  Will watch your condition.  Will get help right away if you are not doing well or get worse. Document Released: 04/11/2005 Document Revised: 11/16/2013 Document Reviewed: 03/10/2007 [facility] Patient Information 2015 [place], [place]. This information is not intended to replace advice given to you by your  health care provider. Make sure you discuss any questions you have with your health care provider.  GETTING TO GOOD BOWEL HEALTH. Irregular bowel habits such as constipation and diarrhea can lead to many problems over time.  Having one soft bowel movement a day is the most important way to prevent further problems.  The anorectal canal is designed to  handle stretching and feces to safely manage our ability to get rid of solid waste (feces, poop, stool) out of our body.  BUT, hard constipated stools can act like ripping concrete bricks and diarrhea can be a burning fire to this very sensitive area of our body, causing inflamed hemorrhoids, anal fissures, increasing risk is perirectal abscesses, abdominal pain/bloating, an making irritable bowel worse.     The goal: ONE SOFT BOWEL MOVEMENT A DAY!  To have soft, regular bowel movements:    Drink at least 8 tall glasses of water a day.     Take plenty of fiber.  Fiber is the undigested part of plant food that passes into the colon, acting s "natures broom" to encourage bowel motility and movement.  Fiber can absorb and hold large amounts of water. This results in a larger, bulkier stool, which is soft and easier to pass. Work gradually over several weeks up to 6 servings a day of fiber (25g a day even more if needed) in the form of: o Vegetables -- Root (potatoes, carrots, turnips), leafy green (lettuce, salad greens, celery, spinach), or cooked high residue (cabbage, broccoli, etc) o Fruit -- Fresh (unpeeled skin & pulp), Dried (prunes, apricots, cherries, etc ),  or stewed ( applesauce)  o Whole grain breads, pasta, etc (whole wheat)  o Bran cereals    Bulking Agents -- This type of water-retaining fiber generally is easily obtained each day by one of the following:  o Psyllium bran -- The psyllium plant is remarkable because its ground seeds can retain so much water. This product is available as Metamucil, Konsyl, Effersyllium, Per Diem Fiber, or the less expensive generic preparation in drug and health food stores. Although labeled a laxative, it really is not a laxative.  o Methylcellulose -- This is another fiber derived from wood which also retains water. It is available as Citrucel. o Polyethylene Glycol - and "artificial" fiber commonly called Miralax or Glycolax.  It is helpful for people with  gassy or bloated feelings with regular fiber o Flax Seed - a less gassy fiber than psyllium   No reading or other relaxing activity while on the toilet. If bowel movements take longer than 5 minutes, you are too constipated   AVOID CONSTIPATION.  High fiber and water intake usually takes care of this.  Sometimes a laxative is needed to stimulate more frequent bowel movements, but    Laxatives are not a good long-term solution as it can wear the colon out. o Osmotics (Milk of Magnesia, Fleets phosphosoda, Magnesium citrate, MiraLax, GoLytely) are safer than  o Stimulants (Senokot, Castor Oil, Dulcolax, Ex Lax)    o Do not take laxatives for more than 7days in a row.    IF SEVERELY CONSTIPATED, try a Bowel Retraining Program: o Do not use laxatives.  o Eat a diet high in roughage, such as bran cereals and leafy vegetables.  o Drink six (6) ounces of prune or apricot juice each morning.  o Eat two (2) large servings of stewed fruit each day.  o Take one (1) heaping tablespoon of a psyllium-based bulking agent twice a  day. Use sugar-free sweetener when possible to avoid excessive calories.  o Eat a normal breakfast.  o Set aside 15 minutes after breakfast to sit on the toilet, but do not strain to have a bowel movement.  o If you do not have a bowel movement by the third day, use an enema and repeat the above steps.    Controlling diarrhea o Switch to liquids and simpler foods for a few days to avoid stressing your intestines further. o Avoid dairy products (especially milk & ice cream) for a short time.  The intestines often can lose the ability to digest lactose when stressed. o Avoid foods that cause gassiness or bloating.  Typical foods include beans and other legumes, cabbage, broccoli, and dairy foods.  Every person has some sensitivity to other foods, so listen to our body and avoid those foods that trigger problems for you. o Adding fiber (Citrucel, Metamucil, psyllium, Miralax) gradually  can help thicken stools by absorbing excess fluid and retrain the intestines to act more normally.  Slowly increase the dose over a few weeks.  Too much fiber too soon can backfire and cause cramping & bloating. o Probiotics (such as active yogurt, Align, etc) may help repopulate the intestines and colon with normal bacteria and calm down a sensitive digestive tract.  Most studies show it to be of mild help, though, and such products can be costly. o Medicines:   Bismuth subsalicylate (ex. Kayopectate, Pepto Bismol) every 30 minutes for up to 6 doses can help control diarrhea.  Avoid if pregnant.   Loperamide (Immodium) can slow down diarrhea.  Start with two tablets (  total) first and then try one tablet every 6 hours.  Avoid if you are having fevers or severe pain.  If you are not better or start feeling worse, stop all medicines and call your doctor for advice o Call your doctor if you are getting worse or not better.  Sometimes further testing (cultures, endoscopy, X-ray studies, bloodwork, etc) may be needed to help diagnose and treat the cause of the diarrhea.

## 2014-03-08 NOTE — Progress Notes (Signed)
Subjective:     Patient ID: [name], female   DOB: 1981-03-09, 33 y.o.   MRN: [id number]  HPI  Note: Portions of this report may have been transcribed using voice recognition software. Every effort was made to ensure accuracy; however, inadvertent computerized transcription errors may be present.   Any transcriptional errors that result from this process are unintentional.            Cristen Tetterton  05-23-1981 [id number]  Patient Care Team: Cain Saupe, MD as PCP - General (Family Medicine) Meryl Dare, MD as Consulting Physician (Gastroenterology) Boneta Lucks, NP as Nurse Practitioner (Nurse Practitioner) Griffith Citron, MD as Consulting Physician (Gastroenterology)  This patient is a 33 y.o.female who presents today for surgical evaluation at the request of Dr. Jillyn Hidden.   Reason for visit: Anal pain and swelling.  ?  Hemorrhoids  Pleasant young female.  History of intermittent abdominal complaints.  Found to have ileal thickening on CT scan and endoscopy biopsies concerning for ileitis.  Very suspicious for Crohn's.  Patient seems to been skeptical that diagnosis and despite improvements on oral steroids weaned off and has not taken any medication for thisfor years.  Switched to different gastroenterologist, Dr Kinnie Scales.  Patient does have issues of intermittent constipation.  This despite being on Benefiber at least twice a day.Often has to take a laxative to get a bowel movement once a week.  Sometimes even enemas.  She had an episode of perianal swelling.  But she had a hemorrhoid.  There has been discussion about her having fissures in the past.  Colon through her old chart she did have surgery 10 years ago which sounds like perianal fistulous disease.  She denies any purulent drainage.  Her stools do tend to be narrow caliber.  No major bleeding.  No severe pain with bowel movements but occasional sharp discomfort.  No personal nor family history of GI/colon  cancer, irritable bowel syndrome, allergy such as Celiac Sprue, dietary/dairy problems, colitis, ulcers nor gastritis.  No recent sick contacts/gastroenteritis.  No travel outside the country.  No changes in diet.  No dysphagia to solids or liquids.  No significant heartburn or reflux.  No hematochezia, hematemesis, coffee ground emesis.  No evidence of prior gastric/peptic ulceration.    Patient Active Problem List   Diagnosis Date Noted  . Crohn's disease of ileum 03/08/2014  . ANEMIA-IRON DEFICIENCY 09/14/2008  . ANEMIA-B12 DEFICIENCY 09/14/2008  . ABDOMINAL PAIN-EPIGASTRIC 09/14/2008  . VITAMIN B12 DEFICIENCY 07/22/2008  . ANEMIA-UNSPECIFIED 07/22/2008  . REGIONAL ENTERITIS OF SMALL INTESTINE 07/22/2008  . ABDOMINAL PAIN RIGHT LOWER QUADRANT 07/22/2008    Past Medical History  Diagnosis Date  . Crohn's ileocolitis 06/2008  . Iron deficiency anemia   . B12 deficiency   . Anal fissure     Past Surgical History  Procedure Laterality Date  . Rectovaginal fistula closure    . Hernia repair    . Colonoscopy      History   Social History  . Marital Status: Single    Spouse Name: N/A    Number of Children: N/A  . Years of Education: N/A   Occupational History  . [employer]    Social History Main Topics  . Smoking status: Never Smoker   . Smokeless tobacco: Never Used  . Alcohol Use: No  . Drug Use: No  . Sexual Activity: Not on file   Other Topics Concern  . Not on file   Social History Narrative  .  No narrative on file    Family History  Problem Relation Age of Onset  . Lung cancer Father     Current Outpatient Prescriptions  Medication Sig Dispense Refill  . ferrous fumarate (HEMOCYTE - 106 MG FE) 325 (106 FE) MG TABS tablet Take 1 tablet by mouth.       No current facility-administered medications for this visit.     No Known Allergies  BP 90/62  Pulse 68  Temp(Src) 98.1 F (36.7 C) (Oral)  Ht  (1.676 m)  Wt 139 lb 8 oz (63.277 kg)  BMI  22.53 kg/m2  No results found.   Review of Systems  Constitutional: Negative for fever, chills, diaphoresis, appetite change and fatigue.  HENT: Negative for ear discharge, ear pain, sore throat and trouble swallowing.   Eyes: Negative for photophobia, discharge and visual disturbance.  Respiratory: Negative for cough, choking, chest tightness and shortness of breath.   Cardiovascular: Negative for chest pain and palpitations.  Gastrointestinal: Negative for nausea, vomiting, abdominal pain, diarrhea, constipation, anal bleeding and rectal pain.  Endocrine: Negative for cold intolerance and heat intolerance.  Genitourinary: Negative for dysuria, frequency and difficulty urinating.  Musculoskeletal: Negative for gait problem, myalgias and neck pain.  Skin: Negative for color change, pallor and rash.  Allergic/Immunologic: Negative for environmental allergies, food allergies and immunocompromised state.  Neurological: Negative for dizziness, speech difficulty, weakness and numbness.  Hematological: Negative for adenopathy.  Psychiatric/Behavioral: Negative for confusion and agitation. The patient is not nervous/anxious.        Objective:   Physical Exam  Constitutional: She is oriented to person, place, and time. She appears well-developed and well-nourished. No distress.  HENT:  Head: Normocephalic.  Mouth/Throat: Oropharynx is clear and moist. No oropharyngeal exudate.  Eyes: Conjunctivae and EOM are normal. Pupils are equal, round, and reactive to light. No scleral icterus.  Neck: Normal range of motion. Neck supple. No tracheal deviation present.  Cardiovascular: Normal rate, regular rhythm and intact distal pulses.   Pulmonary/Chest: Effort normal and breath sounds normal. No stridor. No respiratory distress. She exhibits no tenderness.  Abdominal: Soft. She exhibits no distension and no mass. There is no tenderness. Hernia confirmed negative in the right inguinal area and  confirmed negative in the left inguinal area.  Genitourinary:    No vaginal discharge found.  Exam done with assistance of female Medical Assistant in the room.  Perianal skin clean with good hygiene.  No pruritis.  No pilonidal disease.  No fissure.  No abscess/fistula.  Normal sphincter tone.  Tolerates digital rectal exam.    Stricture 2cm from anus at anorectal junction 7mm diameter orifice only - cannot tolerate finger.  Anoscopy aborted  Musculoskeletal: Normal range of motion. She exhibits no tenderness.       Right elbow: She exhibits normal range of motion.       Left elbow: She exhibits normal range of motion.       Right wrist: She exhibits normal range of motion.       Left wrist: She exhibits normal range of motion.       Right hand: Normal strength noted.       Left hand: Normal strength noted.  Lymphadenopathy:       Head (right side): No posterior auricular adenopathy present.       Head (left side): No posterior auricular adenopathy present.    She has no cervical adenopathy.    She has no axillary adenopathy.  Right: No inguinal adenopathy present.       Left: No inguinal adenopathy present.  Neurological: She is alert and oriented to person, place, and time. No cranial nerve deficit. She exhibits normal muscle tone. Coordination normal.  Skin: Skin is warm and dry. No rash noted. She is not diaphoretic. No erythema.  Psychiatric: She has a normal mood and affect. Her behavior is normal. Judgment and thought content normal.       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  No strong evidence of cancer/abscess/fistula/fissure.     Plan:     Spent some time going over her pass interventions by gastroenterology and surgery.  Reviewed records.  Discussed with her at length.  History and physical.    I would start with incrasing fiber bowel regimen.  Increase her Benefiber or switch to a different fiber  supplement such as MiraLAX until she is having one soft bowel movement a day.  If she is taking 6-8 doses a day and still struggling with constipation, consider reevaluation with gastroenterology with probable repeat endoscopy to rule out ileal colonic stricture or other etiologies for her constipation.  Another consideration is a serial anal dilations & evaluation with pelvic floor therapy.  I would not do that first.  I want to make sure that her diagnosis of Crohn's is locked down and she is on appropriate treatment for that otherwise we could cause more problems until I note that her information is minimized to minimize scarring.  I gave her copies of his CT scan and pathology reports noting strong suspicions of Crohn's disease 5 years ago.  She asked me about using Aloe Vera juice to treat her Crohn's.  I told her I was skeptical that that would be a realistic option but would defer to her gastroenterologist.  It has not been as rigorously studied as most standard allopathic medical regimens such as anti-inflammatories and immunosuppressants.  I cautioned that natural history of Crohn's is highly variable but I did not want her to ignore the diagnosis until she has a major problem such as complete obstruction, fistula, abscess.  Nothing on this exam makes me feel she is that severe yet.  I will try and get records from her primary care office and other gastroenterologist to see whether insights are there.   I suspect that the stricture at the anorectal junction is benign.  However, I would consider examination under anesthesia with biopsies and possible dilations if this is still an issue.  Removal of for now until more information is available.  I will discuss with my colorectal colleagues.

## 2014-07-01 ENCOUNTER — Other Ambulatory Visit: Payer: Self-pay | Admitting: Family

## 2014-07-01 ENCOUNTER — Ambulatory Visit
Admission: RE | Admit: 2014-07-01 | Discharge: 2014-07-01 | Disposition: A | Discharge status: Home / Self Care | Payer: BC Managed Care – PPO | Source: Ambulatory Visit | Attending: Family | Admitting: Family

## 2014-07-01 DIAGNOSIS — R079 Chest pain, unspecified: Secondary | ICD-10-CM

## 2014-07-06 ENCOUNTER — Emergency Department (HOSPITAL_COMMUNITY)
Admission: EM | Admit: 2014-07-06 | Discharge: 2014-07-07 | Disposition: A | Discharge status: Home / Self Care | Payer: BC Managed Care – PPO | Attending: Emergency Medicine | Admitting: Emergency Medicine

## 2014-07-06 ENCOUNTER — Encounter (HOSPITAL_COMMUNITY): Payer: Self-pay | Admitting: *Deleted

## 2014-07-06 DIAGNOSIS — R079 Chest pain, unspecified: Secondary | ICD-10-CM | POA: Diagnosis present

## 2014-07-06 DIAGNOSIS — D509 Iron deficiency anemia, unspecified: Secondary | ICD-10-CM | POA: Diagnosis not present

## 2014-07-06 DIAGNOSIS — Z8719 Personal history of other diseases of the digestive system: Secondary | ICD-10-CM | POA: Insufficient documentation

## 2014-07-06 DIAGNOSIS — R109 Unspecified abdominal pain: Secondary | ICD-10-CM | POA: Diagnosis not present

## 2014-07-06 DIAGNOSIS — Z720 Tobacco use: Secondary | ICD-10-CM | POA: Diagnosis not present

## 2014-07-06 DIAGNOSIS — M940 Chondrocostal junction syndrome [Tietze]: Secondary | ICD-10-CM

## 2014-07-06 DIAGNOSIS — Z3202 Encounter for pregnancy test, result negative: Secondary | ICD-10-CM | POA: Insufficient documentation

## 2014-07-06 DIAGNOSIS — R52 Pain, unspecified: Secondary | ICD-10-CM

## 2014-07-06 DIAGNOSIS — Z79899 Other long term (current) drug therapy: Secondary | ICD-10-CM | POA: Insufficient documentation

## 2014-07-06 DIAGNOSIS — Z8639 Personal history of other endocrine, nutritional and metabolic disease: Secondary | ICD-10-CM | POA: Insufficient documentation

## 2014-07-06 LAB — CBC
HCT: 31.9 % — ABNORMAL LOW (ref 36.0–46.0)
Hemoglobin: 10.1 g/dL — ABNORMAL LOW (ref 12.0–15.0)
MCH: 23.1 pg — ABNORMAL LOW (ref 26.0–34.0)
MCHC: 31.7 g/dL (ref 30.0–36.0)
MCV: 73 fL — ABNORMAL LOW (ref 78.0–100.0)
PLATELETS: 386 10*3/uL (ref 150–400)
RBC: 4.37 MIL/uL (ref 3.87–5.11)
RDW: 16.4 % — ABNORMAL HIGH (ref 11.5–15.5)
WBC: 4.5 10*3/uL (ref 4.0–10.5)

## 2014-07-06 LAB — COMPREHENSIVE METABOLIC PANEL
ALK PHOS: 60 U/L (ref 39–117)
ALT: 11 U/L (ref 0–35)
ANION GAP: 9 (ref 5–15)
AST: 19 U/L (ref 0–37)
Albumin: 3.2 g/dL — ABNORMAL LOW (ref 3.5–5.2)
BUN: 8 mg/dL (ref 6–23)
CALCIUM: 8.6 mg/dL (ref 8.4–10.5)
CO2: 21 mmol/L (ref 19–32)
Chloride: 105 mEq/L (ref 96–112)
Creatinine, Ser: 0.5 mg/dL (ref 0.50–1.10)
GFR calc non Af Amer: >90 mL/min (ref >90)
GLUCOSE: 92 mg/dL (ref 70–99)
POTASSIUM: 3.2 mmol/L — AB (ref 3.5–5.1)
Sodium: 135 mmol/L (ref 135–145)
Total Bilirubin: 0.1 mg/dL — ABNORMAL LOW (ref 0.3–1.2)
Total Protein: 7.5 g/dL (ref 6.0–8.3)

## 2014-07-06 LAB — POC URINE PREG, ED: Preg Test, Ur: NEGATIVE

## 2014-07-06 LAB — I-STAT TROPONIN, ED: Troponin i, poc: 0 ng/mL (ref 0.00–0.08)

## 2014-07-06 MED ORDER — KETOROLAC TROMETHAMINE 30 MG/ML IJ SOLN
30.0000 mg | Freq: Once | INTRAMUSCULAR | Status: AC
  Administered 2014-07-06: 30 mg via INTRAVENOUS
  Filled 2014-07-06: qty 1

## 2014-07-06 NOTE — ED Notes (Signed)
MD at bedside. 

## 2014-07-06 NOTE — ED Notes (Signed)
Pt states she was seen at her PCP for chest pain. Pt had an elevated D-dimer, had another D-dimer and new result was higher than the last. Pt reports intermittent chest pain with inspiration.

## 2014-07-06 NOTE — ED Provider Notes (Signed)
CSN: [id number]     Arrival date & time 07/06/14  2243 History  This chart was scribed for [name] Laura Freeman-Rasch, MD by Freida Busmaniana Omoyeni, ED Scribe. This patient was seen in room A11C/A11C and the patient's care was started 11:33 PM.    Chief Complaint  Patient presents with  . Abnormal Lab  . Chest Pain      Patient is a 33 y.o. female presenting with chest pain. The history is provided by the patient. No language interpreter was used.  Chest Pain Pain location:  Substernal area Pain quality: sharp   Pain radiates to:  Does not radiate Pain radiates to the back: no   Pain severity:  Moderate Timing:  Constant Progression:  Unchanged Chronicity:  New Context: movement   Relieved by:  Nothing Worsened by:  Certain positions Associated symptoms: no abdominal pain, no cough, no fever, no palpitations and no shortness of breath   Risk factors: no birth control, no diabetes mellitus, no high cholesterol, no immobilization, not obese, not pregnant and no prior DVT/PE      HPI Comments:  [name] is a 33 y.o. female who presents to the Emergency Department complaining of moderate central  CP for about 1.5 weeks. She notes her pain is worse with certain movements.  Pt states she saw her PCP for CP; had an elevated d-dimer in office; she had another d-dimer about 1 week later that was also elevated. She denies trouble breathing, BLE edema/pain, and cough   She denies use of BCP, acute trauma,long periods of immobilization, recent air travel, and recent surgical procedures. No alleviating factors noted.  PCP Boneta LucksJennifer Benjamin   Past Medical History  Diagnosis Date  . Crohn's ileocolitis 06/2008  . Iron deficiency anemia   . B12 deficiency   . Anal fissure   . Gastritis 2013    EGD.  Dr Lamont SnowballM. Stark   Past Surgical History  Procedure Laterality Date  . Incision and drainage abscess anal  01/18/2004    Dr Daphine DeutscherMartin  . Hernia repair    . Colonoscopy     Family History  Problem  Relation Age of Onset  . Lung cancer Father    History  Substance Use Topics  . Smoking status: Never Smoker   . Smokeless tobacco: Never Used  . Alcohol Use: No   OB History    No data available     Review of Systems  Constitutional: Negative for fever and chills.  Respiratory: Negative for cough and shortness of breath.   Cardiovascular: Positive for chest pain. Negative for palpitations and leg swelling.  Gastrointestinal: Negative for abdominal pain.  All other systems reviewed and are negative.     Allergies  Review of patient's allergies indicates no known allergies.  Home Medications   Prior to Admission medications   Medication Sig Start Date End Date Taking? Authorizing Provider  ferrous fumarate (HEMOCYTE - 106 MG FE) 325 (106 FE) MG TABS tablet Take 1 tablet by mouth.    Historical Provider, MD   BP 117/73 mmHg  Pulse 92  Resp 16  SpO2 100% Physical Exam  Constitutional: She is oriented to person, place, and time. She appears well-developed and well-nourished. No distress.  HENT:  Head: Normocephalic and atraumatic.  Mouth/Throat: Oropharynx is clear and moist.  Eyes: Conjunctivae are normal. Pupils are equal, round, and reactive to light.  Neck: Normal range of motion. Neck supple.  Cardiovascular: Normal rate, regular rhythm, normal heart sounds and intact distal pulses.  Pulmonary/Chest: Effort normal and breath sounds normal. No respiratory distress.  Abdominal: Soft. Bowel sounds are normal. She exhibits no distension. There is no tenderness. There is no rebound and no guarding.  Musculoskeletal: Normal range of motion. She exhibits no edema or tenderness.  Neurological: She is alert and oriented to person, place, and time. She has normal reflexes.  Skin: Skin is warm and dry.  Psychiatric: She has a normal mood and affect.  Nursing note and vitals reviewed.   ED Course  Procedures   DIAGNOSTIC STUDIES:  Oxygen Saturation is 100% on RA,  normal by my interpretation.    COORDINATION OF CARE:  11:37 PM Discussed treatment plan with pt at bedside and pt agreed to plan.  Labs Review Labs Reviewed  CBC  BRAIN NATRIURETIC PEPTIDE  D-DIMER, QUANTITATIVE  COMPREHENSIVE METABOLIC PANEL  I-STAT TROPOININ, ED    Imaging Review No results found.   EKG Interpretation   Date/Time:  Tuesday July 06 2014 22:52:19 EST Ventricular Rate:  91 PR Interval:  138 QRS Duration: 80 QT Interval:  354 QTC Calculation: 435 R Axis:   95 Text Interpretation:  Normal sinus rhythm Confirmed by [facility]  MD,  Randall Colden ([id number]) on 07/06/2014 11:23:37 PM      MDM   Final diagnoses:  Chest pain    CT negative for PE suspect costochondritis will treat with ibuprofen.  Follow up with your PMD  I personally performed the services described in this documentation, which was scribed in my presence. The recorded information has been reviewed and is accurate.     Jasmine AweApril K Emmajean Ratledge-Rasch, MD 07/07/14 [phone]

## 2014-07-07 ENCOUNTER — Emergency Department (HOSPITAL_COMMUNITY): Payer: BC Managed Care – PPO

## 2014-07-07 ENCOUNTER — Encounter (HOSPITAL_COMMUNITY): Payer: Self-pay | Admitting: Emergency Medicine

## 2014-07-07 LAB — D-DIMER, QUANTITATIVE: D-Dimer, Quant: 0.98 ug/mL-FEU — ABNORMAL HIGH (ref 0.00–0.48)

## 2014-07-07 LAB — BRAIN NATRIURETIC PEPTIDE: B Natriuretic Peptide: 12.5 pg/mL (ref 0.0–100.0)

## 2014-07-07 MED ORDER — NAPROXEN 375 MG PO TABS: 375.0000 mg | ORAL_TABLET | Freq: Two times a day (BID) | ORAL | Status: AC

## 2014-07-07 MED ORDER — IOHEXOL 350 MG/ML SOLN
100.0000 mL | Freq: Once | INTRAVENOUS | Status: AC | PRN
  Administered 2014-07-07: 70 mL via INTRAVENOUS

## 2014-09-13 ENCOUNTER — Other Ambulatory Visit (HOSPITAL_COMMUNITY)
Admission: RE | Admit: 2014-09-13 | Discharge: 2014-09-13 | Disposition: A | Discharge status: Home / Self Care | Payer: BLUE CROSS/BLUE SHIELD | Source: Ambulatory Visit | Attending: Obstetrics and Gynecology | Admitting: Obstetrics and Gynecology

## 2014-09-13 ENCOUNTER — Other Ambulatory Visit: Payer: Self-pay | Admitting: Obstetrics and Gynecology

## 2014-09-13 DIAGNOSIS — Z01411 Encounter for gynecological examination (general) (routine) with abnormal findings: Secondary | ICD-10-CM | POA: Diagnosis not present

## 2014-09-13 DIAGNOSIS — Z113 Encounter for screening for infections with a predominantly sexual mode of transmission: Secondary | ICD-10-CM | POA: Insufficient documentation

## 2014-09-17 LAB — CYTOLOGY - PAP

## 2015-01-26 ENCOUNTER — Encounter: Payer: Self-pay | Admitting: Gastroenterology

## 2015-03-30 DIAGNOSIS — J029 Acute pharyngitis, unspecified: Secondary | ICD-10-CM | POA: Insufficient documentation

## 2015-03-30 DIAGNOSIS — Z8639 Personal history of other endocrine, nutritional and metabolic disease: Secondary | ICD-10-CM | POA: Insufficient documentation

## 2015-03-30 DIAGNOSIS — Z79899 Other long term (current) drug therapy: Secondary | ICD-10-CM | POA: Diagnosis not present

## 2015-03-30 DIAGNOSIS — J3489 Other specified disorders of nose and nasal sinuses: Secondary | ICD-10-CM | POA: Diagnosis not present

## 2015-03-30 DIAGNOSIS — Z862 Personal history of diseases of the blood and blood-forming organs and certain disorders involving the immune mechanism: Secondary | ICD-10-CM | POA: Diagnosis not present

## 2015-03-30 DIAGNOSIS — R05 Cough: Secondary | ICD-10-CM | POA: Insufficient documentation

## 2015-03-30 DIAGNOSIS — H578 Other specified disorders of eye and adnexa: Secondary | ICD-10-CM | POA: Diagnosis present

## 2015-03-30 DIAGNOSIS — Z8719 Personal history of other diseases of the digestive system: Secondary | ICD-10-CM | POA: Insufficient documentation

## 2015-03-31 ENCOUNTER — Encounter (HOSPITAL_COMMUNITY): Payer: Self-pay | Admitting: *Deleted

## 2015-03-31 ENCOUNTER — Emergency Department (HOSPITAL_COMMUNITY)
Admission: EM | Admit: 2015-03-31 | Discharge: 2015-03-31 | Disposition: A | Discharge status: Home / Self Care | Payer: BLUE CROSS/BLUE SHIELD | Attending: Emergency Medicine | Admitting: Emergency Medicine

## 2015-03-31 DIAGNOSIS — H04203 Unspecified epiphora, bilateral lacrimal glands: Secondary | ICD-10-CM

## 2015-03-31 MED ORDER — FLUORESCEIN SODIUM 1 MG OP STRP
1.0000 | ORAL_STRIP | Freq: Once | OPHTHALMIC | Status: AC
  Administered 2015-03-31: 1 via OPHTHALMIC
  Filled 2015-03-31: qty 1

## 2015-03-31 MED ORDER — CETIRIZINE HCL 10 MG PO CAPS: 10.0000 mg | ORAL_CAPSULE | Freq: Once | ORAL | Status: AC

## 2015-03-31 MED ORDER — HYPROMELLOSE (GONIOSCOPIC) 2.5 % OP SOLN: 1.0000 [drp] | Freq: Three times a day (TID) | OPHTHALMIC | Status: AC | PRN

## 2015-03-31 MED ORDER — TETRACAINE HCL 0.5 % OP SOLN
2.0000 [drp] | Freq: Once | OPHTHALMIC | Status: AC
  Administered 2015-03-31: 2 [drp] via OPHTHALMIC
  Filled 2015-03-31: qty 2

## 2015-03-31 NOTE — Discharge Instructions (Signed)
We saw you in the ER for the eye irritation and burning. The eye exam reveals no abrasions, tears, ulcers, and the eye pH is neutral.  The workup in the ER is not complete, and is limited to screening for life threatening and emergent conditions only, so please see a primary care doctor for further evaluation.  Artificial Tears eye ointment What is this medicine? ARTIFICIAL TEARS (ahr tuh FISH uhl teerz) soothes irritation and discomfort caused by dry eyes. This medicine may be used for other purposes; ask your health care provider or pharmacist if you have questions. COMMON BRAND NAME(S): Advanced Eye Relief Night Time, Artificial Tears, Eye Lubricant, GenTeal PM, Lacrilube SOP, LubriFresh P.M., Moisture Eyes PM, Puralube, Refresh P.M., Sterilube Nighttime Relief, Systane Nighttime, Tears Naturale PM What should I tell my health care provider before I take this medicine? -change in vision -eye infection or trauma -wear contact lenses -an unusual or allergic reaction to artificial tears, other medicines, foods, dyes, or preservatives -pregnant or trying to get pregnant -breast-feeding How should I use this medicine? This medicine is only for use in the eye. Do not take by mouth. Follow the directions on the label. Wash hands before and after use. Pull down the lower lid of the affected eye and apply a small amount of ointment, roughly one fourth inch, to the inside of the eyelid. Close the eye gently for a few moments to allow contact with the eye. Wipe away any residue with a clean tissue. Use your medicine at regular intervals. Do not use your medicine more often than directed. Talk to your pediatrician regarding the use of this medicine in children. While this medicine may be used in children as young as 6 years for selected conditions, precautions do apply. Overdosage: If you think you have taken too much of this medicine contact a poison control center or emergency room at once. NOTE: This  medicine is only for you. Do not share this medicine with others. What if I miss a dose? If you miss a dose, use it as soon as you can. If it is almost time for your next dose, use only that dose. Do not use double or extra doses. What may interact with this medicine? Interactions are not expected. If you are also using eye drops of any type, use the drops roughly 10 minutes before application of the eye ointment so that the eye ointment does not interfere with the action of the drops. This list may not describe all possible interactions. Give your health care provider a list of all the medicines, herbs, non-prescription drugs, or dietary supplements you use. Also tell them if you smoke, drink alcohol, or use illegal drugs. Some items may interact with your medicine. What should I watch for while using this medicine? If you experience eye pain, changes in vision, continued redness or irritation of the eye, or if your eye condition gets worse or lasts longer than 72 hours, discontinue use and consult your health care professional. To avoid contamination of this product, do not touch the tip of the container to any surface. Do not share this medicine with others. If the product changes color do not use. If you wear contact lenses, you should remove them before putting the ointment in your eyes. Wait at least 15 minutes after putting the ointment in your eyes before putting your contact lenses back in. What side effects may I notice from receiving this medicine? Side effects that you should report to your doctor  or health care professional as soon as possible: -allergic reactions like skin rash, itching or hives, swelling of the face, lips, or tongue -change in vision -eye irritation or redness that gets worse or lasts more than 72 hours -eye pain Side effects that usually do not require medical attention (report to your doctor or health care professional if they continue or are bothersome): -temporary  stinging or blurred vision when applying the eye drops This list may not describe all possible side effects. Call your doctor for medical advice about side effects. You may report side effects to FDA at 1-800-FDA-1088. Where should I keep my medicine? Keep out of the reach of children. Store at room temperature between 15 and 30 degrees C (59 and 86 degrees F). Do not freeze. Throw away any unused medicine after the expiration date. Once the product is opened, most experts recommend discarding the product after 30 days. NOTE: This sheet is a summary. It may not cover all possible information. If you have questions about this medicine, talk to your doctor, pharmacist, or health care provider.  2015, [employer] Standard. (2008-01-02 14:24:35)

## 2015-03-31 NOTE — ED Notes (Signed)
Pt. left ER without notifying staff and did not wait for her discharge papers/instructions .

## 2015-03-31 NOTE — ED Provider Notes (Signed)
CSN: [id number]     Arrival date & time 03/30/15  2357 History  This chart was scribed for Derwood Kaplan, MD by Doreatha Martin, ED Scribe. This patient was seen in room A02C/A02C and the patient's care was started at 12:29 AM.     Chief Complaint  Patient presents with  . Foreign Body in Eye   The history is provided by the patient. No language interpreter was used.    HPI Comments: [name] is a 34 y.o. female who presents to the Emergency Department complaining of moderate, bilateral, burning eye pain (worse on the left) onset this evening just prior to arrival with associated sore throat, cough, rhinorrhea. Pt notes that she was cleaning without gloves using bleach and a scrubber at 1830 today. She states that she was coughing while she was cleaning and had left eye redness and tearing, but no eye pain at that time. She states that she wiped her eye with washcloth from the tearing, but states she did not have contact with the bleach. She states that she took a shower and took a nap after cleaning. She was awoken from her nap due to her symptoms. Pt notes no relieving factors for eye pain. She states that it feels like something is in her eye. No known trauma, no allergies to bleach products. She denies significant outdoor activities today, visual disturbance.   Past Medical History  Diagnosis Date  . Crohn's ileocolitis 06/2008  . Iron deficiency anemia   . B12 deficiency   . Anal fissure   . Gastritis 2013    EGD.  Dr Lamont Snowball   Past Surgical History  Procedure Laterality Date  . Incision and drainage abscess anal  01/18/2004    Dr Daphine Deutscher  . Hernia repair    . Colonoscopy     Family History  Problem Relation Age of Onset  . Lung cancer Father    Social History  Substance Use Topics  . Smoking status: Never Smoker   . Smokeless tobacco: Never Used  . Alcohol Use: Yes     Comment: ocassionally   OB History    No data available     Review of Systems  HENT: Positive  for rhinorrhea and sore throat.   Eyes: Positive for pain and redness. Negative for visual disturbance.       +eye tearing  Respiratory: Positive for cough.   A complete 10 system review of systems was obtained and all systems are negative except as noted in the HPI and PMH.   Allergies  Review of patient's allergies indicates no known allergies.  Home Medications   Prior to Admission medications   Medication Sig Start Date End Date Taking? Authorizing Provider  LINZESS 145 MCG CAPS capsule Take 145 mg by mouth daily. 02/15/15  Yes Historical Provider, MD  Cetirizine HCl (ZYRTEC ALLERGY) 10 MG CAPS Take 1 capsule (10 mg total) by mouth once. 03/31/15   Derwood Kaplan, MD  hydroxypropyl methylcellulose / hypromellose (ISOPTO TEARS / GONIOVISC) 2.5 % ophthalmic solution Place 1 drop into both eyes 3 (three) times daily as needed for dry eyes. 03/31/15   Derwood Kaplan, MD  naproxen (NAPROSYN) 375 MG tablet Take 1 tablet (375 mg total) by mouth 2 (two) times daily. Patient not taking: Reported on 03/31/2015 07/07/14   April Palumbo, MD   BP 109/71 mmHg  Pulse 94  Temp(Src) 98.2 F (36.8 C) (Oral)  Resp 20  Ht  (1.702 m)  Wt 140 lb (63.504  kg)  BMI 21.92 kg/m2  SpO2 100% Physical Exam  Constitutional: She is oriented to person, place, and time. She appears well-developed and well-nourished.  HENT:  Head: Normocephalic and atraumatic.  Mouth/Throat: No oropharyngeal exudate or posterior oropharyngeal erythema.  Posterior oropharynx is clear. No exudates, no erythema.   Eyes: EOM are normal. Pupils are equal, round, and reactive to light. Right conjunctiva is injected. Left conjunctiva is injected.  Slit lamp exam:      The left eye shows no fluorescein uptake.  Mild subconjunctival injection. The conjunctiva appears clear with minimal lateral injection. Pupils are 2mm, equal, reactive to light.  Fluorescein exam reveals no abrasions or ulcerations. Physiologic pH is between 6 and 7.   Neck: Normal range of motion. Neck supple.  No cervical lymphadenopathy.  Cardiovascular: Normal rate.   Pulmonary/Chest: Effort normal and breath sounds normal. No respiratory distress. She has no wheezes.  Lungs CTA bilaterally.   Abdominal: She exhibits no distension.  Musculoskeletal: Normal range of motion.  Neurological: She is alert and oriented to person, place, and time.  Skin: Skin is warm and dry.  Psychiatric: She has a normal mood and affect. Her behavior is normal.  Nursing note and vitals reviewed.  ED Course  Procedures (including critical care time) DIAGNOSTIC STUDIES: Oxygen Saturation is 100% on RA, normal by my interpretation.    COORDINATION OF CARE: 12:37 AM Discussed treatment plan with pt at bedside and pt agreed to plan.   1:23 AM Pt reevaluated, reports relief of symptoms after numbing eye drops.    Labs Review Labs Reviewed - No data to display  Imaging Review No results found. I have personally reviewed and evaluated these images and lab results as part of my medical decision-making.   EKG Interpretation None      MDM   Final diagnoses:  Eye tearing, bilateral    I personally performed the services described in this documentation, which was scribed in my presence. The recorded information has been reviewed and is accurate.  Pt with tearing to the eye. She had used some bleach earlier in the day. Exam reveals no abrasions, ulcers. Visual acuity is normal. No foreign body. She has tearing, we checked the ph of the eye and it is in the normal range. Will tx with artifical tears.   Derwood Kaplan, MD 03/31/15 [phone]

## 2015-03-31 NOTE — ED Notes (Signed)
Patient presents stating she was cleaning with bleach and 409 and her eyes started burning.  Stated she did not get bleach in her eyes but her eyes are red and burning.  Also was SOB after using the cleaning mixture

## 2015-09-16 IMAGING — CT CT ANGIO CHEST
2 of 8 series · 18 of 36 positions shown · IV contrast (omnipaque)
Comparison: Chest radiograph performed 07/01/2014

CLINICAL DATA: Acute onset of generalized intermittent chest pain,
worse with inspiration. Elevated D-dimer. Initial encounter.

EXAM:
CT ANGIOGRAPHY CHEST WITH CONTRAST
TECHNIQUE: Multidetector CT imaging of the chest was performed using the
standard protocol during bolus administration of intravenous
contrast. Multiplanar CT image reconstructions and MIPs were
obtained to evaluate the vascular anatomy.
CONTRAST:  70mL OMNIPAQUE IOHEXOL 350 MG/ML SOLN

[Series 403: thins pacs · axial · 0.63mm/px · z∈[+149,+389]mm · 17 of 272 slices shown]
[im 16/272  lung]
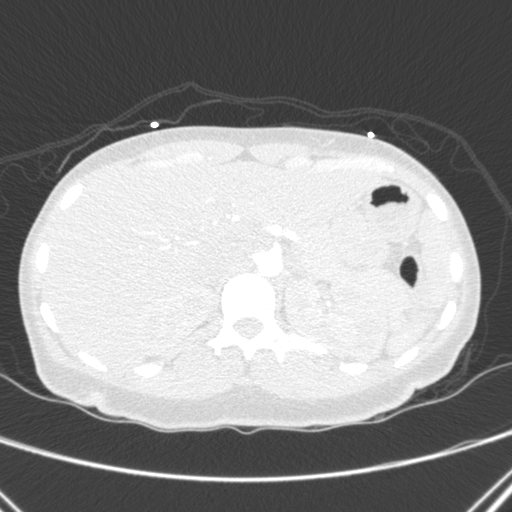
[im 31/272  mediastinal]
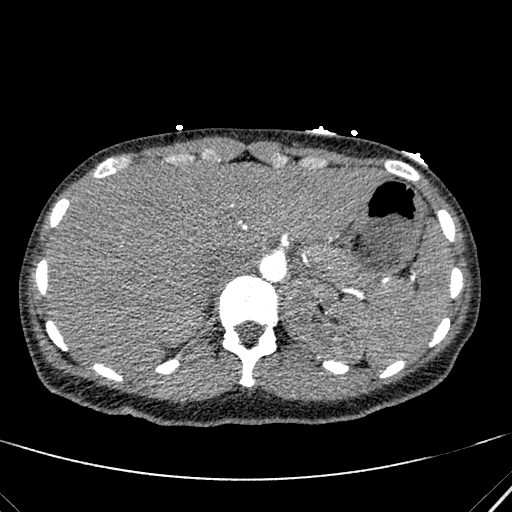
[im 46/272  lung]
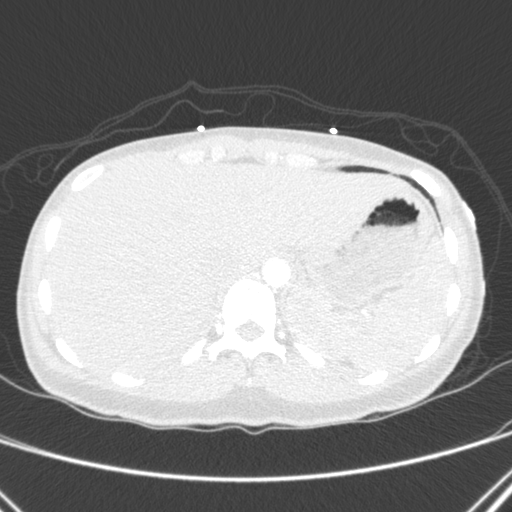
[im 61/272  mediastinal]
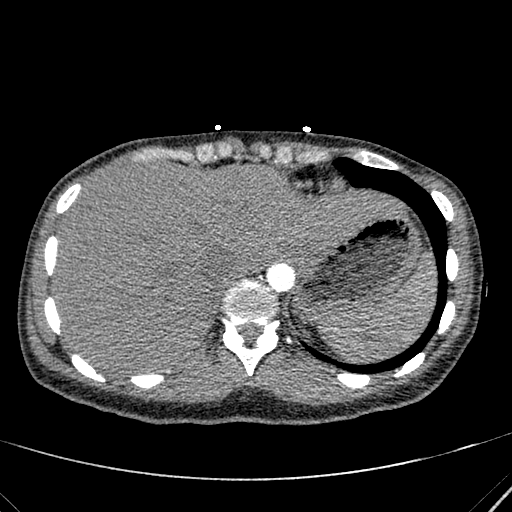
[im 76/272  lung]
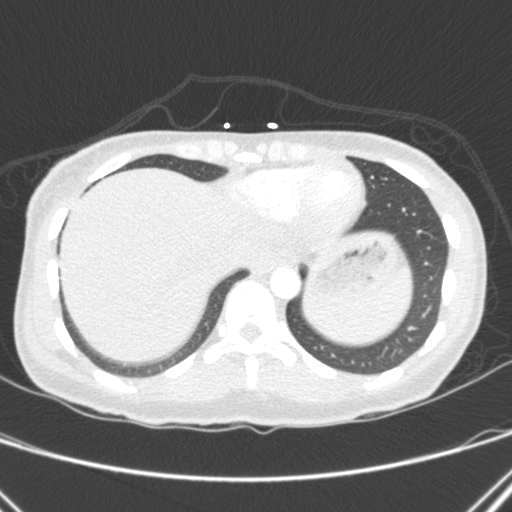
[im 91/272  mediastinal]
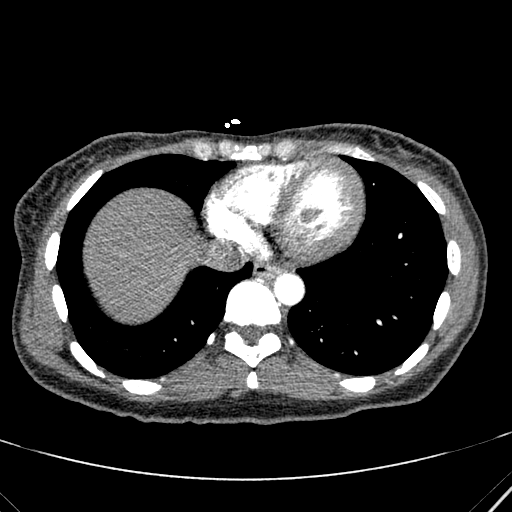
[im 106/272  lung]
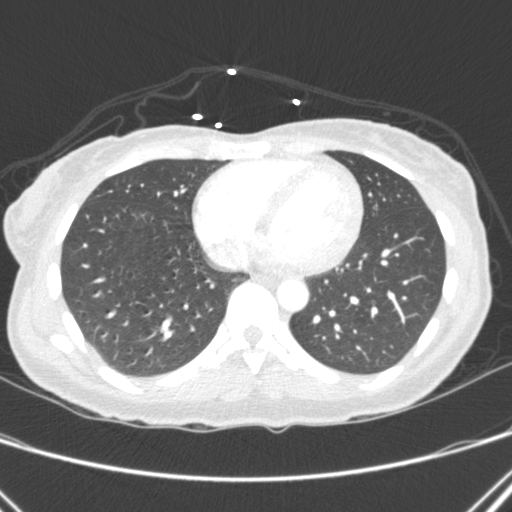
[im 121/272  mediastinal]
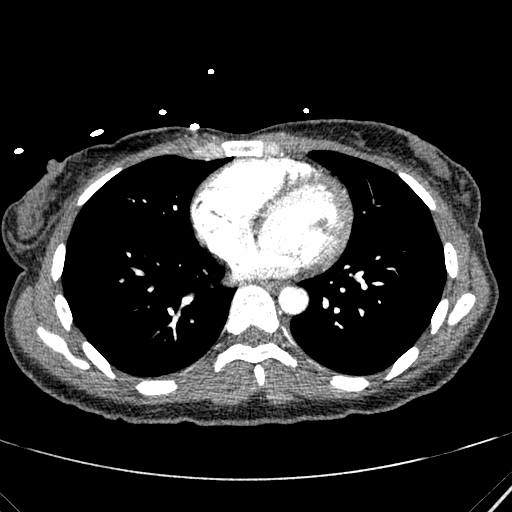
[im 136/272  lung]
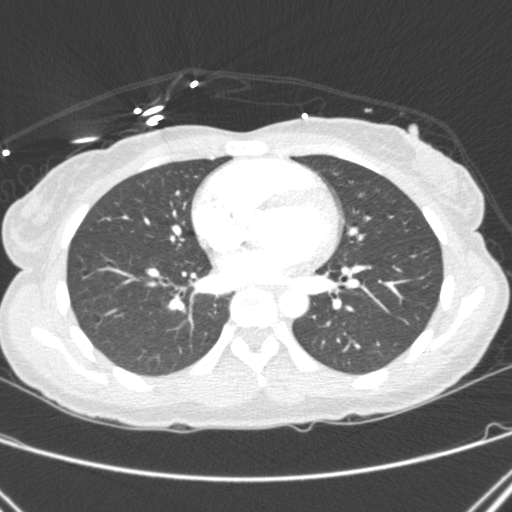
[im 151/272  mediastinal]
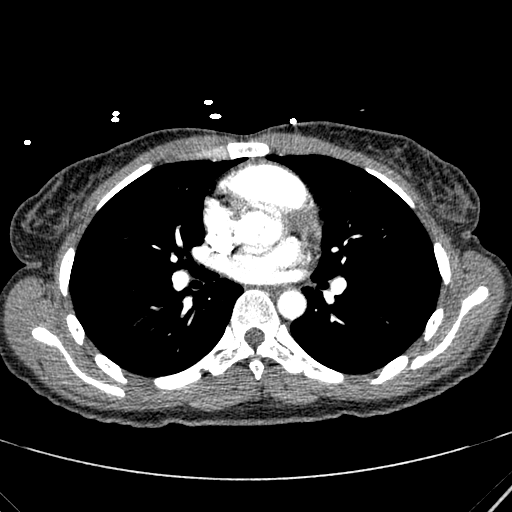
[im 166/272  lung]
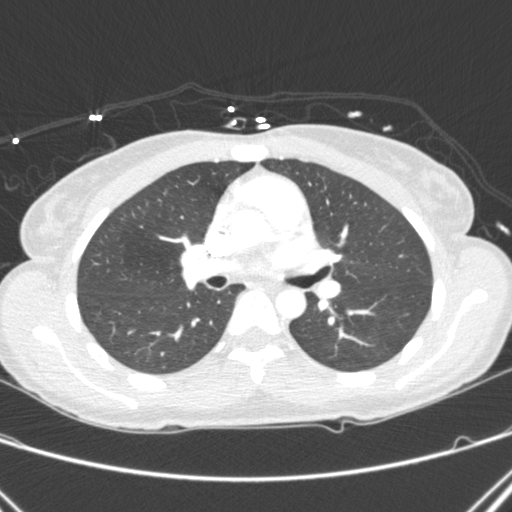
[im 181/272  mediastinal]
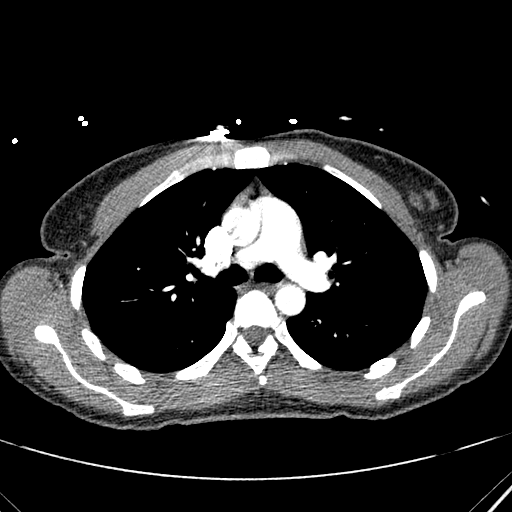
[im 196/272  lung]
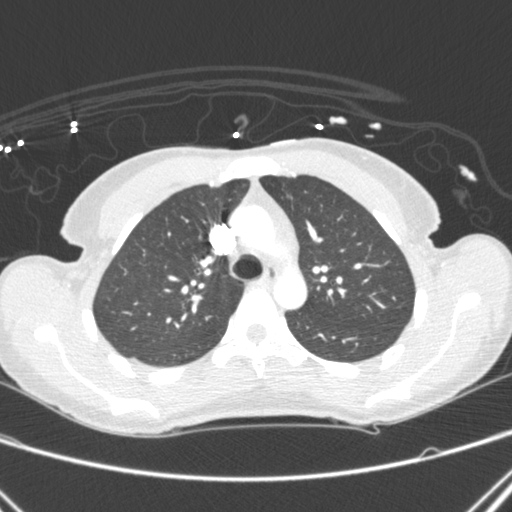
[im 211/272  mediastinal]
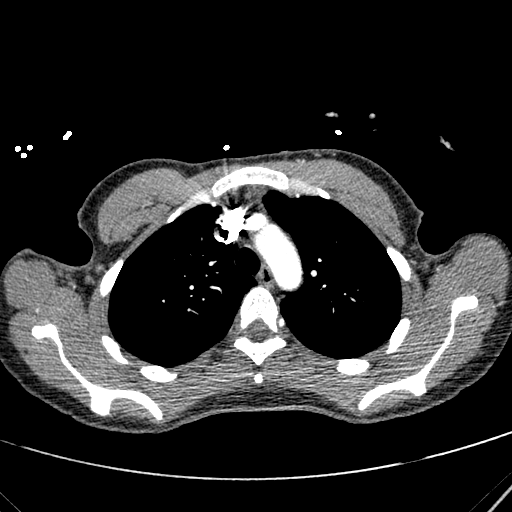
[im 226/272  lung]
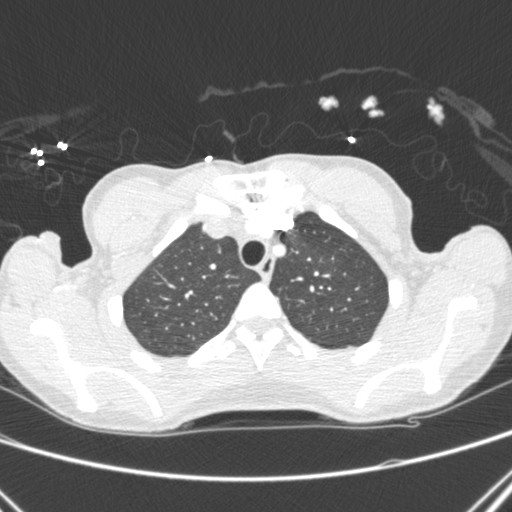
[im 241/272  mediastinal]
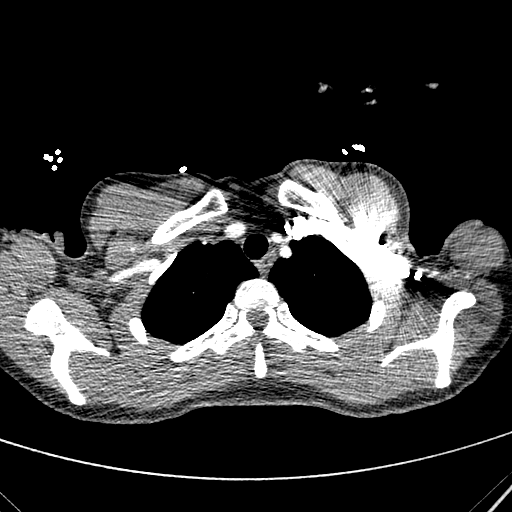
[im 256/272  lung]
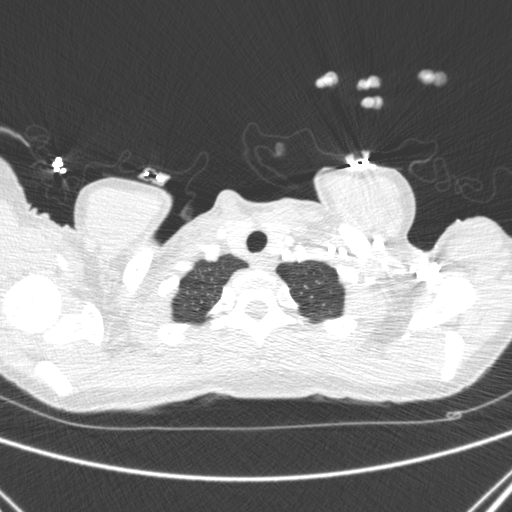

[Series 404: coronals · coronal · 0.63mm/px · 1 of 86 slices shown]
[im 43/86  mediastinal]
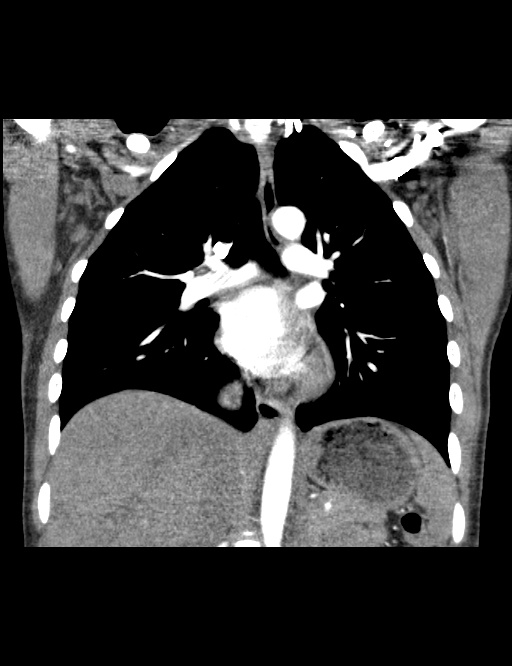

[18 of 36 positions shown; findings below may reference images not displayed]

FINDINGS: There is no evidence of pulmonary embolus.

The lungs are clear bilaterally. There is no evidence of significant
focal consolidation, pleural effusion or pneumothorax. No masses are
identified; no abnormal focal contrast enhancement is seen.

The mediastinum is unremarkable in appearance. No mediastinal
lymphadenopathy is seen. No pericardial effusion is identified. The
great vessels are grossly unremarkable in appearance. No axillary
lymphadenopathy is seen. The visualized portions of the thyroid
gland are unremarkable in appearance.

The visualized portions of the liver and spleen are unremarkable.
The visualized portions of the pancreas, stomach, adrenal glands and
kidneys are within normal limits.

No acute osseous abnormalities are seen.

Review of the MIP images confirms the above findings.
IMPRESSION: Unremarkable CTA of the chest. No evidence of pulmonary embolus.
Lungs remain clear.
